# Patient Record
Sex: Female | Born: 1950 | Race: Black or African American | Hispanic: No | Smoking: Current every day smoker
Health system: Southern US, Community
[De-identification: ages and names within clinical notes are randomized; demographics above are authoritative.]

## PROBLEM LIST (undated history)

## (undated) DIAGNOSIS — I10 Essential (primary) hypertension: Secondary | ICD-10-CM

## (undated) DIAGNOSIS — K759 Inflammatory liver disease, unspecified: Secondary | ICD-10-CM

## (undated) DIAGNOSIS — N189 Chronic kidney disease, unspecified: Secondary | ICD-10-CM

## (undated) DIAGNOSIS — D573 Sickle-cell trait: Secondary | ICD-10-CM

## (undated) DIAGNOSIS — F319 Bipolar disorder, unspecified: Secondary | ICD-10-CM

## (undated) DIAGNOSIS — M1990 Unspecified osteoarthritis, unspecified site: Secondary | ICD-10-CM

## (undated) HISTORY — PX: TUBAL LIGATION: SHX77

## (undated) HISTORY — PX: ABDOMINAL HYSTERECTOMY: SHX81

## (undated) HISTORY — DX: Bipolar disorder, unspecified: F31.9

## (undated) HISTORY — DX: Unspecified osteoarthritis, unspecified site: M19.90

## (undated) HISTORY — DX: Chronic kidney disease, unspecified: N18.9

---

## 2011-08-06 ENCOUNTER — Emergency Department (HOSPITAL_COMMUNITY)
Admission: EM | Admit: 2011-08-06 | Discharge: 2011-08-06 | Disposition: A | Discharge status: Home / Self Care | Payer: Medicare Other | Source: Home / Self Care | Attending: Emergency Medicine | Admitting: Emergency Medicine

## 2011-08-06 ENCOUNTER — Emergency Department (HOSPITAL_COMMUNITY): Payer: Medicare Other

## 2011-08-06 ENCOUNTER — Inpatient Hospital Stay (HOSPITAL_COMMUNITY)
Admission: EM | Admit: 2011-08-06 | Discharge: 2011-08-09 | DRG: 419 | Disposition: A | Discharge status: Home / Self Care | Payer: Medicare Other | Attending: Surgery | Admitting: Surgery

## 2011-08-06 DIAGNOSIS — I10 Essential (primary) hypertension: Secondary | ICD-10-CM | POA: Diagnosis present

## 2011-08-06 DIAGNOSIS — D573 Sickle-cell trait: Secondary | ICD-10-CM | POA: Diagnosis present

## 2011-08-06 DIAGNOSIS — K8000 Calculus of gallbladder with acute cholecystitis without obstruction: Principal | ICD-10-CM | POA: Diagnosis present

## 2011-08-06 DIAGNOSIS — F329 Major depressive disorder, single episode, unspecified: Secondary | ICD-10-CM | POA: Diagnosis present

## 2011-08-06 DIAGNOSIS — R110 Nausea: Secondary | ICD-10-CM | POA: Diagnosis not present

## 2011-08-06 DIAGNOSIS — F17200 Nicotine dependence, unspecified, uncomplicated: Secondary | ICD-10-CM | POA: Diagnosis present

## 2011-08-06 DIAGNOSIS — F3289 Other specified depressive episodes: Secondary | ICD-10-CM | POA: Diagnosis present

## 2011-08-06 LAB — COMPREHENSIVE METABOLIC PANEL
ALT: 57 U/L — ABNORMAL HIGH (ref 0–35)
AST: 52 U/L — ABNORMAL HIGH (ref 0–37)
Albumin: 3.4 g/dL — ABNORMAL LOW (ref 3.5–5.2)
Albumin: 3.3 g/dL — ABNORMAL LOW (ref 3.5–5.2)
Alkaline Phosphatase: 97 U/L (ref 39–117)
BUN: 13 mg/dL (ref 6–23)
Chloride: 108 mEq/L (ref 96–112)
Creatinine, Ser: 0.96 mg/dL (ref 0.50–1.10)
GFR calc Af Amer: 61 mL/min — ABNORMAL LOW (ref >90)
Glucose, Bld: 91 mg/dL (ref 70–99)
Potassium: 3.7 mEq/L (ref 3.5–5.1)
Potassium: 3.8 mEq/L (ref 3.5–5.1)
Sodium: 139 mEq/L (ref 135–145)
Total Bilirubin: 0.4 mg/dL (ref 0.3–1.2)
Total Protein: 8 g/dL (ref 6.0–8.3)
Total Protein: 7.3 g/dL (ref 6.0–8.3)

## 2011-08-06 LAB — URINE MICROSCOPIC-ADD ON

## 2011-08-06 LAB — CBC
HCT: 39.2 % (ref 36.0–46.0)
HCT: 37.2 % (ref 36.0–46.0)
Hemoglobin: 13.2 g/dL (ref 12.0–15.0)
MCH: 32.8 pg (ref 26.0–34.0)
MCH: 32.8 pg (ref 26.0–34.0)
MCHC: 35.7 g/dL (ref 30.0–36.0)
MCHC: 35.5 g/dL (ref 30.0–36.0)
MCV: 91.8 fL (ref 78.0–100.0)
RBC: 4.03 MIL/uL (ref 3.87–5.11)
RDW: 12.3 % (ref 11.5–15.5)
WBC: 4.9 10*3/uL (ref 4.0–10.5)

## 2011-08-06 LAB — URINALYSIS, ROUTINE W REFLEX MICROSCOPIC
Bilirubin Urine: NEGATIVE
Glucose, UA: NEGATIVE mg/dL
Hgb urine dipstick: NEGATIVE
Hgb urine dipstick: NEGATIVE
Ketones, ur: NEGATIVE mg/dL
Ketones, ur: NEGATIVE mg/dL
Protein, ur: NEGATIVE mg/dL
Specific Gravity, Urine: 1.011 (ref 1.005–1.030)
Urobilinogen, UA: 1 mg/dL (ref 0.0–1.0)
pH: 6.5 (ref 5.0–8.0)

## 2011-08-06 LAB — RAPID URINE DRUG SCREEN, HOSP PERFORMED
Amphetamines: NOT DETECTED
Benzodiazepines: NOT DETECTED
Opiates: NOT DETECTED
Tetrahydrocannabinol: NOT DETECTED

## 2011-08-06 LAB — DIFFERENTIAL
Eosinophils Relative: 4 % (ref 0–5)
Lymphocytes Relative: 56 % — ABNORMAL HIGH (ref 12–46)
Lymphocytes Relative: 56 % — ABNORMAL HIGH (ref 12–46)
Lymphs Abs: 2.7 10*3/uL (ref 0.7–4.0)
Lymphs Abs: 2.6 10*3/uL (ref 0.7–4.0)
Monocytes Absolute: 0.3 10*3/uL (ref 0.1–1.0)
Monocytes Absolute: 0.3 10*3/uL (ref 0.1–1.0)
Monocytes Relative: 6 % (ref 3–12)
Monocytes Relative: 6 % (ref 3–12)
Neutro Abs: 1.6 10*3/uL — ABNORMAL LOW (ref 1.7–7.7)
Neutrophils Relative %: 34 % — ABNORMAL LOW (ref 43–77)

## 2011-08-06 LAB — LIPASE, BLOOD: Lipase: 49 U/L (ref 11–59)

## 2011-08-07 ENCOUNTER — Other Ambulatory Visit (INDEPENDENT_AMBULATORY_CARE_PROVIDER_SITE_OTHER): Payer: Self-pay | Admitting: Surgery

## 2011-08-07 ENCOUNTER — Inpatient Hospital Stay (HOSPITAL_COMMUNITY): Payer: Medicare Other

## 2011-08-07 DIAGNOSIS — R110 Nausea: Secondary | ICD-10-CM

## 2011-08-07 DIAGNOSIS — R1011 Right upper quadrant pain: Secondary | ICD-10-CM

## 2011-08-07 DIAGNOSIS — K8010 Calculus of gallbladder with chronic cholecystitis without obstruction: Secondary | ICD-10-CM

## 2011-08-07 HISTORY — PX: CHOLECYSTECTOMY: SHX55

## 2011-08-07 LAB — CBC
HCT: 35.1 % — ABNORMAL LOW (ref 36.0–46.0)
Hemoglobin: 12 g/dL (ref 12.0–15.0)
MCH: 31.7 pg (ref 26.0–34.0)
MCHC: 34.2 g/dL (ref 30.0–36.0)
MCV: 92.9 fL (ref 78.0–100.0)
RDW: 12.3 % (ref 11.5–15.5)

## 2011-08-07 LAB — COMPREHENSIVE METABOLIC PANEL
Albumin: 3 g/dL — ABNORMAL LOW (ref 3.5–5.2)
Alkaline Phosphatase: 79 U/L (ref 39–117)
BUN: 13 mg/dL (ref 6–23)
Calcium: 8.8 mg/dL (ref 8.4–10.5)
Creatinine, Ser: 0.98 mg/dL (ref 0.50–1.10)
GFR calc Af Amer: 72 mL/min — ABNORMAL LOW (ref >90)
Glucose, Bld: 94 mg/dL (ref 70–99)
Total Protein: 6.6 g/dL (ref 6.0–8.3)

## 2011-08-09 NOTE — H&P (Addendum)
NAMECADE, OLBERDING            ACCOUNT NO.:  [id number]  MEDICAL RECORD NO.:  [id number]  LOCATION:  [facility]                         FACILITY:  [facility]  PHYSICIAN:  Juanetta Gosling, MDDATE OF BIRTH:  1951/10/16  DATE OF ADMISSION:  08/06/2011 DATE OF DISCHARGE:  08/06/2011                             HISTORY & PHYSICAL   CHIEF COMPLAINT:  Abdominal pain.  HISTORY OF PRESENT ILLNESS:  This is a 60 year old female who has a prior known history per her report of gallstones in 2004, for which she had pain.  She was recommended surgery at that time.  She did not pursue this.  Over the past month, she has had basically consistent right upper quadrant pain that has worsened over at this time.  She has been using a fair amount of ibuprofen for this and this is not relieving her pain over the last several days, which caused her to present to the emergency room.  This is aggravated by eating and movement.  It was somewhat relieved by taking ibuprofen, but then it was not working anymore causing her to come to the emergency room.  Today, she complains of significant right upper quadrant pain as well as some mild diffuse abdominal pain.  She has some nausea associated with this without any emesis.  She has been having bowel movements with less frequency, but has been having them normally without any blood.  She does not have a history of a colonoscopy.  She does not have any weight loss associated with this.  She does not have any urinary symptoms associated with this either she.  PAST MEDICAL HISTORY:  Significant for depression, hypertension, sickle cell trait.  PAST SURGICAL HISTORY:  C-section, bilateral tubal ligation followed by hysterectomy.  FAMILY HISTORY:  Noncontributory.  SOCIAL HISTORY:  She does smoke.  She denied any use of any drugs to me tonight and she drinks occasional alcohol.  DRUG ALLERGIES:  None known.  MEDICATIONS:  Remeron and Ambien.  REVIEW OF  SYSTEMS:  Otherwise negative.  PHYSICAL EXAMINATION:  VITAL SIGNS:  98.8, 76, 18, and 146/91, oxygen saturation is 99%. GENERAL:  She is a well-appearing female in no apparent distress. HEENT:  She has no scleral icterus. NECK:  No cervical adenopathy and is supple. CHEST:  Clear bilaterally. HEART:  Regular rate and rhythm. ABDOMEN:  Soft.  She has bowel sounds present.  She is tender in her right upper quadrant with a Murphy sign, and is mildly tender in her epigastrium as well.  Laboratory evaluation shows a urine drug screen where she does have is positive for cocaine.  Lipase is 49.  Her CMET is significant for creatinine of 0.96, alk mildly elevated at 52, ALT mildly elevated at 53, and normal bilirubin is 0.4.  Urinalysis shows leukocytes.  CBC is significant for white blood cell count of 4.6 hematocrit 37.2, platelets of 281.  Chest. x-ray done tonight shows no evidence of any acute cardiopulmonary disease, and ultrasound of her abdomen shows large gallstones with a gallbladder wall of 5 mm.  The patient is tender over the gallbladder during the procedure and a mildly dilated common duct at 8 mm.  There is also report of distal  gastric wall thickening, questioning gastritis.  ASSESSMENT:  Acute cholecystitis.  PLAN:  We discussed admission making her n.p.o., beginning her on some antibiotics, and with that she will need a cholecystectomy, we discussed that she certainly appears to be high risk for an open cholecystectomy given the length of her symptoms.  I do not think it will be reasonable to proceed with a cholecystectomy this admission.  I think that the likely distal gastric wall thickening may be related to her recent NSAID use, and there is no other indications that is the source of her current problem, but she certainly will need a followup for this over the long- term as well.     Juanetta Gosling, MD   ______________________________ Juanetta Gosling, MD    MCW/MEDQ  D:  08/07/2011  T:  08/07/2011  Job:  [id number]  cc:   Lear Ng, MD  Electronically Signed by Emelia Loron MD on 08/15/2011 03:40:35 PM

## 2011-08-09 NOTE — Op Note (Signed)
NAMENELSY, Natasha Davila            ACCOUNT NO.:  [id number]  MEDICAL RECORD NO.:  [id number]  LOCATION:  5127                         FACILITY:  [facility]  PHYSICIAN:  Velora Heckler, MD      DATE OF BIRTH:  Apr 18, 1951  DATE OF PROCEDURE:  08/07/2011                               OPERATIVE REPORT   PREOPERATIVE DIAGNOSIS:  Symptomatic cholelithiasis.  POSTOPERATIVE DIAGNOSIS:  Symptomatic cholelithiasis.  PROCEDURE:  Laparoscopic cholecystectomy with intraoperative cholangiography.  SURGEON:  Velora Heckler, MD, FACS  ANESTHESIA:  General per Dr. Judie Petit.  ESTIMATED BLOOD LOSS:  Minimal.  PREPARATION:  ChloraPrep.  COMPLICATIONS:  None.  INDICATIONS:  The patient is a 60 year old black female who presents to the emergency department with abdominal pain.  She has had intermittent pain for approximately 6 years.  She has known gallstones.  The patient now presents with biliary colic.  She was seen and evaluated in the emergency department and admitted to the General Surgery Service.  She is prepared for surgery for cholecystectomy.  BODY OF REPORT:  Procedure was done in OR #17 at the [facility].  The patient was brought to the operating room, placed in supine position on the operating room table.  Following administration of general anesthesia, the patient was positioned and then prepped and draped in the usual strict aseptic fashion.  After ascertaining that an adequate level of anesthesia had been achieved, an infraumbilical incision was made with a #15 blade.  Dissection was carried through subcutaneous tissues.  Fascia was incised in the midline and the peritoneal cavity was entered cautiously.  There were adhesions at the level of the umbilicus.  These appeared to be omentum.  There did not appear to be any small bowel adhesions.  Operative ports were placed along the right costal margin in the midline, midclavicular line, and anterior  axillary line.  Gallbladder fundus was grasped and retracted cephalad.  The peritoneum at the neck of the gallbladder was incised. Gallbladder neck was carefully dissected out.  Cystic duct was identified and dissected out along its length.  Clip was placed at the neck of the gallbladder.  Cystic duct was incised.  A Cook cholangiography catheter was introduced through a stab wound in the right upper quadrant.  It was inserted into the cystic duct and secured with a Ligaclip.  Using C-arm fluoroscopy, real time cholangiography was performed.  There was rapid filling of a mildly dilated common bile duct.  There was free flow distally into the duodenum without filling defect or obstruction.  There was reflux of contrast into the right and left hepatic ductal systems.  There was also slight reflux of contrast into the distal pancreatic duct.  Clip was withdrawn and Cook catheter was removed from the peritoneal cavity.  Cystic duct was triply clipped and divided.  The cystic artery was dissected out along its length.  It was doubly clipped proximally and distally and divided.  Gallbladder was then excised from the gallbladder bed using the hook electrocautery for hemostasis.  Gallbladder was completely excised and placed into an EndoCatch bag.  It was withdrawn through the umbilical port without difficulty.  A 0-Vicryl purse-string suture was  tied securely.  Right upper quadrant was irrigated with warm saline.  This was evacuated. Good hemostasis was noted.  Pneumoperitoneum was released and ports were removed under direct vision.  Good hemostasis was noted at all port sites.  Pneumoperitoneum was evacuated.  Port sites were anesthetized with local anesthetic.  Skin incisions were closed with interrupted 4-0 Monocryl subcuticular sutures.  Wounds were washed and dried and benzoin and Steri-Strips were applied.  Sterile dressings were applied.  The patient was awakened from anesthesia and  brought to the recovery room. The patient tolerated the procedure well.   Velora Heckler, MD, FACS     TMG/MEDQ  D:  08/07/2011  T:  08/07/2011  Job:  [id number]  Electronically Signed by Darnell Level MD on 08/09/2011 09:18:16 AM

## 2011-08-21 NOTE — Discharge Summary (Signed)
  Natasha Davila, Natasha Davila            ACCOUNT NO.:  [id number]  MEDICAL RECORD NO.:  [id number]  LOCATION:  5127                         FACILITY:  [facility]  PHYSICIAN:  Juanetta Gosling, MDDATE OF BIRTH:  December 04, 1950  DATE OF ADMISSION:  08/06/2011 DATE OF DISCHARGE:  08/09/2011                              DISCHARGE SUMMARY   HISTORY OF PRESENT ILLNESS:  Ms. Natasha Davila is a 60 year old African American female who has a known history of gallstones, presented with recurrent abdominal pain.  Her workup including a repeat ultrasound showed evidence of gallbladder wall thickening, and clinical exam was consistent with evidence of acute cholecystitis.  Decision was made to admit the patient for operative management.  SUMMARY OF HOSPITAL COURSE:  The patient was admitted on August 06, 2011, was taken to the operating room on August 07, 2011, underwent laparoscopic cholecystectomy with intraoperative cholangiogram showing no evidence of ductal obstruction.  Postoperatively, she had a little bit of weakness and nausea.  She actually felt very unstable walking. We got a Physical Therapy consult who essentially thought she just had some generalized weakness following surgery and that a rolling walker would be sufficient for her to go home.  On postop day 2, she is eating regular diet, no longer any nausea.  She has been passing flatus and voiding well.  She feels appropriate for discharge home today.  DISCHARGE DIAGNOSIS:  Acute cholecystitis status post laparoscopic cholecystectomy.  DISCHARGE MEDICATIONS:  The patient's home meds will be resumed including: 1. Ambien 10 mg daily at bedtime. 2. Mirtazapine 40 mg daily at bedtime.  She is given a prescription     for Vicodin 1-2 tablets q.4 h. p.r.n. pain.  She is given     preprinted discharge instructions and a followup appointment in our     office for about 2 weeks.     Natasha El,  PA-C   ______________________________ Juanetta Gosling, MD    KB/MEDQ  D:  08/09/2011  T:  08/09/2011  Job:  [id number]  Electronically Signed by Natasha Davila  on 08/15/2011 03:44:17 PM Electronically Signed by Darnell Level MD on 08/21/2011 11:09:05 AM

## 2011-08-28 ENCOUNTER — Ambulatory Visit (INDEPENDENT_AMBULATORY_CARE_PROVIDER_SITE_OTHER): Payer: Medicare Other | Admitting: General Surgery

## 2011-08-28 ENCOUNTER — Encounter (INDEPENDENT_AMBULATORY_CARE_PROVIDER_SITE_OTHER): Payer: Self-pay

## 2011-08-28 VITALS — BP 116/86 | HR 72 | Temp 97.2°F | Resp 20 | Ht 66.5 in | Wt 142.2 lb

## 2011-08-28 DIAGNOSIS — K8010 Calculus of gallbladder with chronic cholecystitis without obstruction: Secondary | ICD-10-CM

## 2011-08-28 MED ORDER — HYDROCODONE-ACETAMINOPHEN 10-325 MG PO TABS: 1.0000 | ORAL_TABLET | Freq: Four times a day (QID) | ORAL | Status: DC | PRN

## 2011-08-28 NOTE — Progress Notes (Signed)
Natasha Davila [facility] December 13, 1950 [id number] 08/28/2011   Natasha Davila is a 60 y.o. female who had a laparoscopic cholecystectomy with intraoperative cholangiogram10/15/12, Dr. Gerrit Friends.  The pathology report confirmed cholecystitis, cholelithiasis.  The patient reports that they are feeling well with normal bowel movements and good appetite.  The pre-operative symptoms of abdominal pain, nausea, and vomiting have resolved.  Still complaining of some abd pain, blosting ask for more vicodin    Physical examination - Incisions appear well-healed with no sign of infection or bleeding.   Abdomen - soft, non-tender, strei strips removed.  Impression:  s/p laparoscopic cholecystectomy  Plan:  She may resume a regular diet and full activity.  She may follow-up on a PRN basis.

## 2011-08-28 NOTE — Patient Instructions (Signed)
Call if you have any further problems.

## 2012-03-24 ENCOUNTER — Encounter (HOSPITAL_COMMUNITY): Payer: Self-pay | Admitting: Emergency Medicine

## 2012-03-24 ENCOUNTER — Emergency Department (HOSPITAL_COMMUNITY)
Admission: EM | Admit: 2012-03-24 | Discharge: 2012-03-24 | Disposition: A | Discharge status: Home / Self Care | Payer: Medicare Other | Attending: Emergency Medicine | Admitting: Emergency Medicine

## 2012-03-24 ENCOUNTER — Emergency Department (HOSPITAL_COMMUNITY): Payer: Medicare Other

## 2012-03-24 DIAGNOSIS — F3289 Other specified depressive episodes: Secondary | ICD-10-CM | POA: Insufficient documentation

## 2012-03-24 DIAGNOSIS — I129 Hypertensive chronic kidney disease with stage 1 through stage 4 chronic kidney disease, or unspecified chronic kidney disease: Secondary | ICD-10-CM | POA: Insufficient documentation

## 2012-03-24 DIAGNOSIS — Z79899 Other long term (current) drug therapy: Secondary | ICD-10-CM | POA: Insufficient documentation

## 2012-03-24 DIAGNOSIS — F411 Generalized anxiety disorder: Secondary | ICD-10-CM | POA: Insufficient documentation

## 2012-03-24 DIAGNOSIS — N189 Chronic kidney disease, unspecified: Secondary | ICD-10-CM | POA: Insufficient documentation

## 2012-03-24 DIAGNOSIS — I10 Essential (primary) hypertension: Secondary | ICD-10-CM | POA: Insufficient documentation

## 2012-03-24 DIAGNOSIS — F17200 Nicotine dependence, unspecified, uncomplicated: Secondary | ICD-10-CM | POA: Insufficient documentation

## 2012-03-24 DIAGNOSIS — R079 Chest pain, unspecified: Secondary | ICD-10-CM | POA: Insufficient documentation

## 2012-03-24 DIAGNOSIS — M549 Dorsalgia, unspecified: Secondary | ICD-10-CM | POA: Insufficient documentation

## 2012-03-24 DIAGNOSIS — F329 Major depressive disorder, single episode, unspecified: Secondary | ICD-10-CM | POA: Insufficient documentation

## 2012-03-24 HISTORY — DX: Essential (primary) hypertension: I10

## 2012-03-24 MED ORDER — IBUPROFEN 200 MG PO TABS
600.0000 mg | ORAL_TABLET | Freq: Once | ORAL | Status: AC
  Administered 2012-03-24: 600 mg via ORAL
  Filled 2012-03-24: qty 3

## 2012-03-24 MED ORDER — OXYCODONE-ACETAMINOPHEN 5-325 MG PO TABS
1.0000 | ORAL_TABLET | Freq: Once | ORAL | Status: AC
  Administered 2012-03-24: 1 via ORAL
  Filled 2012-03-24: qty 1

## 2012-03-24 MED ORDER — HYDROCODONE-ACETAMINOPHEN 5-500 MG PO TABS: 1.0000 | ORAL_TABLET | Freq: Four times a day (QID) | ORAL | Status: AC | PRN

## 2012-03-24 NOTE — ED Notes (Signed)
Pt returned from xr

## 2012-03-24 NOTE — ED Notes (Signed)
Pt c/o of neck and back constant burning sensation.

## 2012-03-24 NOTE — ED Notes (Signed)
MD at bedside. 
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
## 2012-03-24 NOTE — ED Notes (Signed)
I cut off patients shirts to put on her leads and placed it and her shoes in a patient belongings bag.

## 2012-03-24 NOTE — ED Notes (Signed)
Drenda Freeze, PA made aware of backboard removal.
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
## 2012-09-26 ENCOUNTER — Emergency Department (HOSPITAL_COMMUNITY)
Admission: EM | Admit: 2012-09-26 | Discharge: 2012-09-26 | Disposition: A | Discharge status: Home / Self Care | Payer: Medicare Other | Attending: Emergency Medicine | Admitting: Emergency Medicine

## 2012-09-26 ENCOUNTER — Encounter (HOSPITAL_COMMUNITY): Payer: Self-pay | Admitting: Family Medicine

## 2012-09-26 DIAGNOSIS — N189 Chronic kidney disease, unspecified: Secondary | ICD-10-CM | POA: Insufficient documentation

## 2012-09-26 DIAGNOSIS — R109 Unspecified abdominal pain: Secondary | ICD-10-CM | POA: Insufficient documentation

## 2012-09-26 DIAGNOSIS — Z791 Long term (current) use of non-steroidal anti-inflammatories (NSAID): Secondary | ICD-10-CM | POA: Insufficient documentation

## 2012-09-26 DIAGNOSIS — F17200 Nicotine dependence, unspecified, uncomplicated: Secondary | ICD-10-CM | POA: Insufficient documentation

## 2012-09-26 DIAGNOSIS — N390 Urinary tract infection, site not specified: Secondary | ICD-10-CM

## 2012-09-26 DIAGNOSIS — F341 Dysthymic disorder: Secondary | ICD-10-CM | POA: Insufficient documentation

## 2012-09-26 DIAGNOSIS — Z79899 Other long term (current) drug therapy: Secondary | ICD-10-CM | POA: Insufficient documentation

## 2012-09-26 DIAGNOSIS — I10 Essential (primary) hypertension: Secondary | ICD-10-CM | POA: Insufficient documentation

## 2012-09-26 LAB — CBC WITH DIFFERENTIAL/PLATELET
Basophils Absolute: 0 10*3/uL (ref 0.0–0.1)
HCT: 39.1 % (ref 36.0–46.0)
Hemoglobin: 13.3 g/dL (ref 12.0–15.0)
Lymphocytes Relative: 49 % — ABNORMAL HIGH (ref 12–46)
Lymphs Abs: 2.5 10*3/uL (ref 0.7–4.0)
Monocytes Absolute: 0.3 10*3/uL (ref 0.1–1.0)
Neutro Abs: 2 10*3/uL (ref 1.7–7.7)
RBC: 4.28 MIL/uL (ref 3.87–5.11)
RDW: 12.2 % (ref 11.5–15.5)
WBC: 5 10*3/uL (ref 4.0–10.5)

## 2012-09-26 LAB — URINALYSIS, ROUTINE W REFLEX MICROSCOPIC
Protein, ur: 100 mg/dL — AB
Urobilinogen, UA: 1 mg/dL (ref 0.0–1.0)

## 2012-09-26 LAB — COMPREHENSIVE METABOLIC PANEL
ALT: 36 U/L — ABNORMAL HIGH (ref 0–35)
AST: 44 U/L — ABNORMAL HIGH (ref 0–37)
CO2: 27 mEq/L (ref 19–32)
Chloride: 106 mEq/L (ref 96–112)
Creatinine, Ser: 1.24 mg/dL — ABNORMAL HIGH (ref 0.50–1.10)
GFR calc non Af Amer: 46 mL/min — ABNORMAL LOW (ref >90)
Glucose, Bld: 119 mg/dL — ABNORMAL HIGH (ref 70–99)
Sodium: 144 mEq/L (ref 135–145)
Total Bilirubin: 0.6 mg/dL (ref 0.3–1.2)

## 2012-09-26 LAB — URINE MICROSCOPIC-ADD ON

## 2012-09-26 MED ORDER — CEFTRIAXONE SODIUM 1 G IJ SOLR
1.0000 g | Freq: Once | INTRAMUSCULAR | Status: AC
  Administered 2012-09-26: 1 g via INTRAMUSCULAR
  Filled 2012-09-26: qty 10

## 2012-09-26 MED ORDER — DEXTROSE 5 % IV SOLN: 1.0000 g | Freq: Once | INTRAVENOUS | Status: DC

## 2012-09-26 MED ORDER — LIDOCAINE HCL (PF) 1 % IJ SOLN
INTRAMUSCULAR | Status: AC
  Administered 2012-09-26: 5 mL
  Filled 2012-09-26: qty 5

## 2012-09-26 MED ORDER — SODIUM CHLORIDE 0.9 % IV BOLUS (SEPSIS)
1000.0000 mL | Freq: Once | INTRAVENOUS | Status: DC
Start: 2012-09-26 — End: 2012-09-26

## 2012-09-26 MED ORDER — ONDANSETRON HCL 4 MG/2ML IJ SOLN: 4.0000 mg | Freq: Once | INTRAMUSCULAR | Status: DC

## 2012-09-26 MED ORDER — HYDROMORPHONE HCL PF 1 MG/ML IJ SOLN: 1.0000 mg | Freq: Once | INTRAMUSCULAR | Status: DC

## 2012-09-26 MED ORDER — PHENAZOPYRIDINE HCL 200 MG PO TABS: 200.0000 mg | ORAL_TABLET | Freq: Three times a day (TID) | ORAL | Status: DC | PRN

## 2012-09-26 MED ORDER — CEPHALEXIN 500 MG PO CAPS: 500.0000 mg | ORAL_CAPSULE | Freq: Four times a day (QID) | ORAL | Status: DC

## 2012-09-26 NOTE — ED Notes (Signed)
Per pt sts she has had hematuria since yesterday. sts also some abdominal cramping.

## 2012-09-26 NOTE — ED Notes (Signed)
Pt unable to void right now. 
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
## 2012-09-26 NOTE — ED Notes (Signed)
Pt requesting to be discharged. States she left her groceries out at home and needs to go home. PA notified.

## 2012-09-27 LAB — URINE CULTURE

## 2012-09-28 NOTE — ED Provider Notes (Signed)
History     CSN: [id number]  Arrival date & time 09/26/12  1327   First MD Initiated Contact with Patient 09/26/12 1912      Chief Complaint  Patient presents with  . Dysuria    (Consider location/radiation/quality/duration/timing/severity/associated sxs/prior treatment) HPI 61 yo female with pmh of papillary necrosis of the kidney presents with cc of hematuria.  She has had intermittent bouts of hematuria over her lifetime. She has no nephrologist or urologist here in town and has not had hematuria in many years.  Onset yesterday and described as profuse.  Patient states she just moved and was lifting heavy boxes. C/o suprapubic pain. Denies dysuria, flank pain, frequency, or urgency.  Not sexually active. UA nitrite positive and shows infection.  Denies fevers, chills, myalgias, arthralgias. Denies DOE, SOB, chest tightness or pressure, radiation to left arm, jaw or back, or diaphoresis. Denies headaches, light headedness, weakness, visual disturbances. Denies abdominal pain, nausea, vomiting, diarrhea or constipation.     Past Medical History  Diagnosis Date  . Arthritis   . Chronic kidney disease   . Hypertension   . Depression   . Anxiety     Past Surgical History  Procedure Date  . Cholecystectomy 08/07/11  . Abdominal hysterectomy   . Tubal ligation     History reviewed. No pertinent family history.  History  Substance Use Topics  . Smoking status: Current Every Day Smoker -- 0.2 packs/day  . Smokeless tobacco: Never Used  . Alcohol Use: Yes    OB History    Grav Para Term Preterm Abortions TAB SAB Ect Mult Living                  Review of Systems Ten systems are reviewed and are negative for acute change except as noted in the HPI  Allergies  Review of patient's allergies indicates no known allergies.  Home Medications   Current Outpatient Rx  Name  Route  Sig  Dispense  Refill  . CYCLOBENZAPRINE HCL 10 MG PO TABS   Oral   Take 10 mg by mouth  3 (three) times daily as needed. For muscle spasms         . DICLOFENAC SODIUM 75 MG PO TBEC   Oral   Take 75 mg by mouth 2 (two) times daily.         Marland Kitchen HYDROCODONE-ACETAMINOPHEN 5-325 MG PO TABS   Oral   Take 1 tablet by mouth 2 (two) times daily as needed. For pain         . IBUPROFEN 200 MG PO TABS   Oral   Take 800-1,600 mg by mouth every 6 (six) hours as needed. For pain         . LIDOCAINE 5 % EX PTCH   Transdermal   Place 1 patch onto the skin daily. Remove & Discard patch within 12 hours or as directed by MD         . LISINOPRIL 10 MG PO TABS   Oral   Take 10 mg by mouth daily.         Marland Kitchen MIRTAZAPINE 45 MG PO TABS   Oral   Take 45 mg by mouth at bedtime.           Marland Kitchen QUETIAPINE FUMARATE 25 MG PO TABS   Oral   Take 25-50 mg by mouth at bedtime as needed. For sleep         . ZOLPIDEM TARTRATE 5 MG PO TABS  Oral   Take 5 mg by mouth at bedtime as needed. For sleep         . CEPHALEXIN 500 MG PO CAPS   Oral   Take 1 capsule (500 mg total) by mouth 4 (four) times daily.   28 capsule   0   . PHENAZOPYRIDINE HCL 200 MG PO TABS   Oral   Take 1 tablet (200 mg total) by mouth 3 (three) times daily as needed for pain.   6 tablet   0     BP 167/106  Pulse 99  Temp 97.8 F (36.6 C) (Oral)  Resp 20  SpO2 98%  Physical Exam Physical Exam  Nursing note and vitals reviewed. Constitutional: She is oriented to person, place, and time. She appears well-developed and well-nourished. No distress.  HENT:  Head: Normocephalic and atraumatic.  Eyes: Conjunctivae normal and EOM are normal. Pupils are equal, round, and reactive to light. No scleral icterus.  Neck: Normal range of motion.  Cardiovascular: Normal rate, regular rhythm and normal heart sounds.  Exam reveals no gallop and no friction rub.   No murmur heard. Pulmonary/Chest: Effort normal and breath sounds normal. No respiratory distress.  Abdominal: Soft. Bowel sounds are normal. She  exhibits no distension and no mass. There is no tenderness. There is no guarding.  Neurological: She is alert and oriented to person, place, and time. 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 Skin: Skin is warm and dry. She is not diaphoretic.    ED Course  Procedures (including critical care time)  Labs Reviewed  CBC WITH DIFFERENTIAL - Abnormal; Notable for the following:    Neutrophils Relative 41 (*)     Lymphocytes Relative 49 (*)     All other components within normal limits  COMPREHENSIVE METABOLIC PANEL - Abnormal; Notable for the following:    Potassium 3.4 (*)     Glucose, Bld 119 (*)     Creatinine, Ser 1.24 (*)     Total Protein 8.5 (*)     AST 44 (*)     ALT 36 (*)     GFR calc non Af Amer 46 (*)     GFR calc Af Amer 54 (*)     All other components within normal limits  URINALYSIS, ROUTINE W REFLEX MICROSCOPIC - Abnormal; Notable for the following:    Color, Urine RED (*)  BIOCHEMICALS MAY BE AFFECTED BY COLOR   APPearance TURBID (*)     Hgb urine dipstick LARGE (*)     Bilirubin Urine MODERATE (*)     Ketones, ur 15 (*)     Protein, ur 100 (*)     Nitrite POSITIVE (*)     Leukocytes, UA MODERATE (*)     All other components within normal limits  URINE MICROSCOPIC-ADD ON - Abnormal; Notable for the following:    Bacteria, UA FEW (*)     Casts HYALINE CASTS (*)     All other components within normal limits  URINE CULTURE  LAB REPORT - SCANNED   No results found.   1. UTI (lower urinary tract infection)       MDM   Filed Vitals:   09/26/12 1909  BP: 167/106  Pulse: 99  Temp:   Resp: 20   Patient with likely UA and possible component of papillary necrosis.  She has mildly elevated creatinine and renal insufficiency ans small transaminitis.  Patient states that she left her groceries out and needs to get home. She  does not want an IV, she will take IM rocephin.  I will allow patient to leave, but she needs close f/u with her PCP and urology or  nephrology. Especially if bleeding does not resolve within the next 3 days.  I have explained lab findings and make patient aware of her elevated BP as well.  Will d/c with keflex. PCP and urology f/u. Discussed reasons to seek immediate care. Patient expresses understanding and agrees with plan.         Arthor Captain, PA-C 09/28/12 [phone]

## 2012-09-28 NOTE — ED Provider Notes (Signed)
Medical screening examination/treatment/procedure(s) were performed by non-physician practitioner and as supervising physician I was immediately available for consultation/collaboration.  Derwood Kaplan, MD 09/28/12 2324

## 2012-10-16 ENCOUNTER — Other Ambulatory Visit: Payer: Self-pay | Admitting: Internal Medicine

## 2012-10-16 DIAGNOSIS — Z1231 Encounter for screening mammogram for malignant neoplasm of breast: Secondary | ICD-10-CM

## 2012-10-30 ENCOUNTER — Ambulatory Visit: Payer: Medicare Other

## 2012-12-12 ENCOUNTER — Other Ambulatory Visit (HOSPITAL_COMMUNITY): Payer: Self-pay | Admitting: Orthopaedic Surgery

## 2012-12-22 ENCOUNTER — Encounter (HOSPITAL_COMMUNITY): Payer: Self-pay | Admitting: Pharmacy Technician

## 2012-12-29 ENCOUNTER — Encounter (HOSPITAL_COMMUNITY): Payer: Self-pay

## 2012-12-29 ENCOUNTER — Encounter (HOSPITAL_COMMUNITY)
Admission: RE | Admit: 2012-12-29 | Discharge: 2012-12-29 | Disposition: A | Discharge status: Home / Self Care | Payer: Medicare Other | Source: Ambulatory Visit | Attending: Orthopaedic Surgery | Admitting: Orthopaedic Surgery

## 2012-12-29 HISTORY — DX: Inflammatory liver disease, unspecified: K75.9

## 2012-12-29 HISTORY — DX: Sickle-cell trait: D57.3

## 2012-12-29 LAB — COMPREHENSIVE METABOLIC PANEL
AST: 47 U/L — ABNORMAL HIGH (ref 0–37)
Albumin: 3.5 g/dL (ref 3.5–5.2)
Alkaline Phosphatase: 101 U/L (ref 39–117)
BUN: 9 mg/dL (ref 6–23)
Chloride: 104 mEq/L (ref 96–112)
Potassium: 3.6 mEq/L (ref 3.5–5.1)
Total Bilirubin: 0.5 mg/dL (ref 0.3–1.2)

## 2012-12-29 LAB — CBC
HCT: 40.8 % (ref 36.0–46.0)
MCH: 30.3 pg (ref 26.0–34.0)
MCHC: 32.8 g/dL (ref 30.0–36.0)
MCV: 92.3 fL (ref 78.0–100.0)
RDW: 12 % (ref 11.5–15.5)
WBC: 5.2 10*3/uL (ref 4.0–10.5)

## 2012-12-29 LAB — PROTIME-INR: INR: 0.92 (ref 0.00–1.49)

## 2012-12-29 LAB — URINALYSIS, ROUTINE W REFLEX MICROSCOPIC
Hgb urine dipstick: NEGATIVE
Specific Gravity, Urine: 1.018 (ref 1.005–1.030)
Urobilinogen, UA: 2 mg/dL — ABNORMAL HIGH (ref 0.0–1.0)

## 2012-12-29 LAB — URINE MICROSCOPIC-ADD ON

## 2012-12-29 LAB — ABO/RH: ABO/RH(D): A POS

## 2012-12-29 LAB — SURGICAL PCR SCREEN: Staphylococcus aureus: POSITIVE — AB

## 2012-12-29 NOTE — Patient Instructions (Signed)
Natasha Davila  12/29/2012   Your procedure is scheduled on:  01/02/13   Report to [facility] at   1215pm  Call this number if you have problems the morning of surgery: [phone]   Remember:   Do not eat food or drink liquids after midnight.   Take these medicines the morning of surgery with A SIP OF WATER:    Do not wear jewelry, make-up or nail polish.  Do not wear lotions, powders, or perfumes. You may wear deodorant.  Do not shave 48 hours prior to surgery.   Do not bring valuables to the hospital.  Contacts, dentures or bridgework may not be worn into surgery.  Leave suitcase in the car. After surgery it may be brought to your room.  For patients admitted to the hospital, checkout time is 11:00 AM the day of  discharge.       SEE CHG INSTRUCTION SHEET    Please read over the following fact sheets that you were given: MRSA Information, coughing and deep breathing exercises, leg exercises, Blood Transfusion Fact sheet                Failure to comply with these instructions may result in cancellation of your surgery.                Patient Signature ____________________________              Nurse Signature _____________________________

## 2012-12-29 NOTE — Progress Notes (Signed)
Initial blood pressure at preop appointment was 174/107.  AT end of preop appointment blood pressure was 159/99.  Patient voiced no complaints and states blood pressure both top and bottom numbers are always in the 3 digits.  Just an FYI.

## 2012-12-30 NOTE — Progress Notes (Signed)
UA faxed to Dr. Maureen Ralphs thru [employer] - spoke with Lupita Leash at office

## 2013-01-02 ENCOUNTER — Encounter (HOSPITAL_COMMUNITY)
Admission: RE | Disposition: A | Discharge status: Skilled Nursing Facility | Payer: Self-pay | Source: Ambulatory Visit | Attending: Orthopaedic Surgery

## 2013-01-02 ENCOUNTER — Encounter (HOSPITAL_COMMUNITY): Payer: Self-pay | Admitting: Anesthesiology

## 2013-01-02 ENCOUNTER — Encounter (HOSPITAL_COMMUNITY): Payer: Self-pay | Admitting: *Deleted

## 2013-01-02 ENCOUNTER — Inpatient Hospital Stay (HOSPITAL_COMMUNITY): Payer: Medicare Other

## 2013-01-02 ENCOUNTER — Inpatient Hospital Stay (HOSPITAL_COMMUNITY)
Admission: RE | Admit: 2013-01-02 | Discharge: 2013-01-05 | DRG: 470 | Disposition: A | Discharge status: Skilled Nursing Facility | Payer: Medicare Other | Source: Ambulatory Visit | Attending: Orthopaedic Surgery | Admitting: Orthopaedic Surgery

## 2013-01-02 ENCOUNTER — Inpatient Hospital Stay (HOSPITAL_COMMUNITY): Payer: Medicare Other | Admitting: Anesthesiology

## 2013-01-02 DIAGNOSIS — I10 Essential (primary) hypertension: Secondary | ICD-10-CM | POA: Diagnosis present

## 2013-01-02 DIAGNOSIS — M1610 Unilateral primary osteoarthritis, unspecified hip: Principal | ICD-10-CM | POA: Diagnosis present

## 2013-01-02 DIAGNOSIS — M169 Osteoarthritis of hip, unspecified: Principal | ICD-10-CM | POA: Diagnosis present

## 2013-01-02 DIAGNOSIS — F17200 Nicotine dependence, unspecified, uncomplicated: Secondary | ICD-10-CM | POA: Diagnosis present

## 2013-01-02 DIAGNOSIS — Z96649 Presence of unspecified artificial hip joint: Secondary | ICD-10-CM

## 2013-01-02 DIAGNOSIS — D62 Acute posthemorrhagic anemia: Secondary | ICD-10-CM | POA: Diagnosis not present

## 2013-01-02 DIAGNOSIS — Z01812 Encounter for preprocedural laboratory examination: Secondary | ICD-10-CM

## 2013-01-02 HISTORY — PX: TOTAL HIP ARTHROPLASTY: SHX124

## 2013-01-02 LAB — TYPE AND SCREEN: ABO/RH(D): A POS

## 2013-01-02 SURGERY — ARTHROPLASTY, HIP, TOTAL, ANTERIOR APPROACH
Anesthesia: Spinal | Site: Hip | Laterality: Left | Wound class: Clean

## 2013-01-02 MED ORDER — FERROUS SULFATE 325 (65 FE) MG PO TABS
325.0000 mg | ORAL_TABLET | Freq: Three times a day (TID) | ORAL | Status: DC
  Administered 2013-01-03 – 2013-01-05 (×7): 325 mg via ORAL
  Filled 2013-01-02 (×11): qty 1

## 2013-01-02 MED ORDER — LISINOPRIL 10 MG PO TABS
10.0000 mg | ORAL_TABLET | Freq: Every day | ORAL | Status: DC
  Administered 2013-01-03 – 2013-01-04 (×2): 10 mg via ORAL
  Filled 2013-01-02 (×4): qty 1

## 2013-01-02 MED ORDER — DIPHENHYDRAMINE HCL 50 MG/ML IJ SOLN
25.0000 mg | Freq: Once | INTRAMUSCULAR | Status: AC
  Administered 2013-01-02: 25 mg via INTRAVENOUS

## 2013-01-02 MED ORDER — METOCLOPRAMIDE HCL 10 MG PO TABS: 5.0000 mg | ORAL_TABLET | Freq: Three times a day (TID) | ORAL | Status: DC | PRN

## 2013-01-02 MED ORDER — METOCLOPRAMIDE HCL 5 MG/ML IJ SOLN
5.0000 mg | Freq: Three times a day (TID) | INTRAMUSCULAR | Status: DC | PRN
Start: 2013-01-02 — End: 2013-01-05

## 2013-01-02 MED ORDER — METHOCARBAMOL 500 MG PO TABS
500.0000 mg | ORAL_TABLET | Freq: Four times a day (QID) | ORAL | Status: DC | PRN
  Administered 2013-01-02 – 2013-01-05 (×3): 500 mg via ORAL
  Filled 2013-01-02 (×3): qty 1

## 2013-01-02 MED ORDER — OXYCODONE HCL ER 10 MG PO T12A
10.0000 mg | EXTENDED_RELEASE_TABLET | Freq: Two times a day (BID) | ORAL | Status: DC
  Administered 2013-01-02 – 2013-01-03 (×2): 10 mg via ORAL
  Filled 2013-01-02 (×3): qty 1

## 2013-01-02 MED ORDER — DIPHENHYDRAMINE HCL 12.5 MG/5ML PO ELIX
12.5000 mg | ORAL_SOLUTION | ORAL | Status: DC | PRN
  Administered 2013-01-03: 25 mg via ORAL
  Administered 2013-01-03 (×2): 12.5 mg via ORAL
  Filled 2013-01-02 (×2): qty 5
  Filled 2013-01-02: qty 10
  Filled 2013-01-02: qty 5

## 2013-01-02 MED ORDER — DOCUSATE SODIUM 100 MG PO CAPS
100.0000 mg | ORAL_CAPSULE | Freq: Two times a day (BID) | ORAL | Status: DC
  Administered 2013-01-02 – 2013-01-05 (×6): 100 mg via ORAL
  Filled 2013-01-02 (×3): qty 1

## 2013-01-02 MED ORDER — MIRTAZAPINE 45 MG PO TABS
45.0000 mg | ORAL_TABLET | Freq: Every day | ORAL | Status: DC
  Administered 2013-01-02 – 2013-01-04 (×3): 45 mg via ORAL
  Filled 2013-01-02 (×4): qty 1

## 2013-01-02 MED ORDER — DIPHENHYDRAMINE HCL 25 MG PO CAPS
25.0000 mg | ORAL_CAPSULE | Freq: Four times a day (QID) | ORAL | Status: DC | PRN
  Administered 2013-01-02: 25 mg via ORAL
  Filled 2013-01-02: qty 1

## 2013-01-02 MED ORDER — ONDANSETRON HCL 4 MG PO TABS: 4.0000 mg | ORAL_TABLET | Freq: Four times a day (QID) | ORAL | Status: DC | PRN

## 2013-01-02 MED ORDER — MENTHOL 3 MG MT LOZG: 1.0000 | LOZENGE | OROMUCOSAL | Status: DC | PRN

## 2013-01-02 MED ORDER — QUETIAPINE FUMARATE 50 MG PO TABS
50.0000 mg | ORAL_TABLET | Freq: Every day | ORAL | Status: DC
  Administered 2013-01-02 – 2013-01-04 (×3): 50 mg via ORAL
  Filled 2013-01-02 (×5): qty 1

## 2013-01-02 MED ORDER — HYDROMORPHONE HCL PF 1 MG/ML IJ SOLN
0.2500 mg | INTRAMUSCULAR | Status: DC | PRN
  Administered 2013-01-02 (×2): 0.5 mg via INTRAVENOUS

## 2013-01-02 MED ORDER — OXYCODONE HCL 5 MG PO TABS
5.0000 mg | ORAL_TABLET | ORAL | Status: DC | PRN
  Administered 2013-01-02: 5 mg via ORAL
  Administered 2013-01-03 (×3): 10 mg via ORAL
  Filled 2013-01-02 (×2): qty 2
  Filled 2013-01-02: qty 1
  Filled 2013-01-02: qty 2

## 2013-01-02 MED ORDER — ONDANSETRON HCL 4 MG/2ML IJ SOLN: 4.0000 mg | Freq: Four times a day (QID) | INTRAMUSCULAR | Status: DC | PRN

## 2013-01-02 MED ORDER — ZOLPIDEM TARTRATE 5 MG PO TABS
5.0000 mg | ORAL_TABLET | Freq: Every evening | ORAL | Status: DC | PRN
  Administered 2013-01-02 – 2013-01-04 (×3): 5 mg via ORAL
  Filled 2013-01-02 (×3): qty 1

## 2013-01-02 MED ORDER — PROMETHAZINE HCL 25 MG/ML IJ SOLN: 6.2500 mg | INTRAMUSCULAR | Status: DC | PRN

## 2013-01-02 MED ORDER — CEFAZOLIN SODIUM-DEXTROSE 2-3 GM-% IV SOLR
2.0000 g | INTRAVENOUS | Status: AC
  Administered 2013-01-02: 2 g via INTRAVENOUS

## 2013-01-02 MED ORDER — ALUM & MAG HYDROXIDE-SIMETH 200-200-20 MG/5ML PO SUSP: 30.0000 mL | ORAL | Status: DC | PRN

## 2013-01-02 MED ORDER — ASPIRIN EC 325 MG PO TBEC
325.0000 mg | DELAYED_RELEASE_TABLET | Freq: Two times a day (BID) | ORAL | Status: DC
  Administered 2013-01-03 – 2013-01-05 (×5): 325 mg via ORAL
  Filled 2013-01-02 (×7): qty 1

## 2013-01-02 MED ORDER — MIDAZOLAM HCL 5 MG/5ML IJ SOLN
INTRAMUSCULAR | Status: DC | PRN
  Administered 2013-01-02: 2 mg via INTRAVENOUS

## 2013-01-02 MED ORDER — METHOCARBAMOL 100 MG/ML IJ SOLN: 500.0000 mg | Freq: Four times a day (QID) | INTRAMUSCULAR | Status: DC | PRN

## 2013-01-02 MED ORDER — PROPOFOL 10 MG/ML IV BOLUS
INTRAVENOUS | Status: DC | PRN
  Administered 2013-01-02: 150 mg via INTRAVENOUS
  Administered 2013-01-02: 50 mg via INTRAVENOUS

## 2013-01-02 MED ORDER — ACETAMINOPHEN 650 MG RE SUPP: 650.0000 mg | Freq: Four times a day (QID) | RECTAL | Status: DC | PRN

## 2013-01-02 MED ORDER — PHENOL 1.4 % MT LIQD: 1.0000 | OROMUCOSAL | Status: DC | PRN

## 2013-01-02 MED ORDER — 0.9 % SODIUM CHLORIDE (POUR BTL) OPTIME
TOPICAL | Status: DC | PRN
  Administered 2013-01-02: 1000 mL

## 2013-01-02 MED ORDER — FENTANYL CITRATE 0.05 MG/ML IJ SOLN
INTRAMUSCULAR | Status: DC | PRN
  Administered 2013-01-02: 50 ug via INTRAVENOUS
  Administered 2013-01-02 (×2): 100 ug via INTRAVENOUS
  Administered 2013-01-02 (×2): 50 ug via INTRAVENOUS

## 2013-01-02 MED ORDER — LACTATED RINGERS IV SOLN
INTRAVENOUS | Status: DC
  Administered 2013-01-02: 14:00:00 via INTRAVENOUS
  Administered 2013-01-02: 1000 mL via INTRAVENOUS
  Administered 2013-01-02: 15:00:00 via INTRAVENOUS

## 2013-01-02 MED ORDER — ONDANSETRON HCL 4 MG/2ML IJ SOLN
INTRAMUSCULAR | Status: DC | PRN
  Administered 2013-01-02: 4 mg via INTRAVENOUS

## 2013-01-02 MED ORDER — HYDROMORPHONE HCL PF 1 MG/ML IJ SOLN: 1.0000 mg | INTRAMUSCULAR | Status: DC | PRN

## 2013-01-02 MED ORDER — SUCCINYLCHOLINE CHLORIDE 20 MG/ML IJ SOLN
INTRAMUSCULAR | Status: DC | PRN
  Administered 2013-01-02: 100 mg via INTRAVENOUS

## 2013-01-02 MED ORDER — SODIUM CHLORIDE 0.9 % IV SOLN
INTRAVENOUS | Status: DC
  Administered 2013-01-02: 19:00:00 via INTRAVENOUS
  Administered 2013-01-03: 500 mL via INTRAVENOUS

## 2013-01-02 MED ORDER — ACETAMINOPHEN 325 MG PO TABS
650.0000 mg | ORAL_TABLET | Freq: Four times a day (QID) | ORAL | Status: DC | PRN
  Filled 2013-01-02: qty 2

## 2013-01-02 MED ORDER — SODIUM CHLORIDE 0.9 % IV SOLN
INTRAVENOUS | Status: DC | PRN
  Administered 2013-01-02: 1000 mL via INTRAMUSCULAR

## 2013-01-02 MED ORDER — HYDROMORPHONE HCL PF 1 MG/ML IJ SOLN
INTRAMUSCULAR | Status: DC | PRN
  Administered 2013-01-02 (×2): 1 mg via INTRAVENOUS

## 2013-01-02 MED ORDER — CEFAZOLIN SODIUM 1-5 GM-% IV SOLN
1.0000 g | Freq: Four times a day (QID) | INTRAVENOUS | Status: AC
  Administered 2013-01-02 – 2013-01-03 (×2): 1 g via INTRAVENOUS
  Filled 2013-01-02 (×2): qty 50

## 2013-01-02 MED ORDER — STERILE WATER FOR IRRIGATION IR SOLN
Status: DC | PRN
  Administered 2013-01-02: 3000 mL

## 2013-01-02 SURGICAL SUPPLY — 40 items
BAG ZIPLOCK 12X15 (MISCELLANEOUS) IMPLANT
BLADE SAW SGTL 18X1.27X75 (BLADE) ×2 IMPLANT
CELLS DAT CNTRL 66122 CELL SVR (MISCELLANEOUS) ×1 IMPLANT
CLOTH BEACON ORANGE TIMEOUT ST (SAFETY) ×2 IMPLANT
DERMABOND ADVANCED (GAUZE/BANDAGES/DRESSINGS) ×1
DERMABOND ADVANCED .7 DNX12 (GAUZE/BANDAGES/DRESSINGS) ×1 IMPLANT
DRAPE C-ARM 42X72 X-RAY (DRAPES) ×2 IMPLANT
DRAPE STERI IOBAN 125X83 (DRAPES) ×2 IMPLANT
DRAPE U-SHAPE 47X51 STRL (DRAPES) ×6 IMPLANT
DRSG AQUACEL AG ADV 3.5X10 (GAUZE/BANDAGES/DRESSINGS) ×2 IMPLANT
DURAPREP 26ML APPLICATOR (WOUND CARE) ×2 IMPLANT
ELECT BLADE TIP CTD 4 INCH (ELECTRODE) ×2 IMPLANT
ELECT REM PT RETURN 9FT ADLT (ELECTROSURGICAL) ×2
ELECTRODE REM PT RTRN 9FT ADLT (ELECTROSURGICAL) ×1 IMPLANT
FACESHIELD LNG OPTICON STERILE (SAFETY) ×8 IMPLANT
GLOVE BIO SURGEON STRL SZ7 (GLOVE) ×2 IMPLANT
GLOVE BIO SURGEON STRL SZ7.5 (GLOVE) ×2 IMPLANT
GLOVE BIOGEL PI IND STRL 7.5 (GLOVE) IMPLANT
GLOVE BIOGEL PI IND STRL 8 (GLOVE) ×1 IMPLANT
GLOVE BIOGEL PI INDICATOR 7.5 (GLOVE)
GLOVE BIOGEL PI INDICATOR 8 (GLOVE) ×1
GLOVE ECLIPSE 7.0 STRL STRAW (GLOVE) ×2 IMPLANT
GLOVE SURG SS PI 7.5 STRL IVOR (GLOVE) ×8 IMPLANT
GOWN STRL REIN XL XLG (GOWN DISPOSABLE) ×8 IMPLANT
HANDPIECE INTERPULSE COAX TIP (DISPOSABLE) ×1
KIT BASIN OR (CUSTOM PROCEDURE TRAY) ×2 IMPLANT
PACK TOTAL JOINT (CUSTOM PROCEDURE TRAY) ×2 IMPLANT
PADDING CAST COTTON 6X4 STRL (CAST SUPPLIES) ×2 IMPLANT
RTRCTR WOUND ALEXIS 18CM MED (MISCELLANEOUS) ×2
SET HNDPC FAN SPRY TIP SCT (DISPOSABLE) ×1 IMPLANT
SUT ETHIBOND NAB CT1 #1 30IN (SUTURE) ×4 IMPLANT
SUT ETHILON 3 0 PS 1 (SUTURE) ×2 IMPLANT
SUT MNCRL AB 4-0 PS2 18 (SUTURE) ×2 IMPLANT
SUT VIC AB 1 CT1 36 (SUTURE) ×4 IMPLANT
SUT VIC AB 2-0 CT1 27 (SUTURE) ×2
SUT VIC AB 2-0 CT1 TAPERPNT 27 (SUTURE) ×2 IMPLANT
SUT VLOC 180 0 24IN GS25 (SUTURE) ×2 IMPLANT
TOWEL OR 17X26 10 PK STRL BLUE (TOWEL DISPOSABLE) ×4 IMPLANT
TOWEL OR NON WOVEN STRL DISP B (DISPOSABLE) ×2 IMPLANT
TRAY FOLEY CATH 14FRSI W/METER (CATHETERS) ×2 IMPLANT

## 2013-01-02 NOTE — Anesthesia Postprocedure Evaluation (Signed)
  Anesthesia Post-op Note  Patient: Natasha Davila  Procedure(s) Performed: Procedure(s) (LRB): LEFT TOTAL HIP ARTHROPLASTY ANTERIOR APPROACH (Left)  Patient Location: PACU  Anesthesia Type: General  Level of Consciousness: awake and alert   Airway and Oxygen Therapy: Patient Spontanous Breathing  Post-op Pain: mild  Post-op Assessment: Post-op Vital signs reviewed, Patient's Cardiovascular Status Stable, Respiratory Function Stable, Patent Airway and No signs of Nausea or vomiting  Last Vitals:  Filed Vitals:   01/02/13 1630  BP:   Pulse: 98  Temp: 36.4 C  Resp:     Post-op Vital Signs: stable   Complications: No apparent anesthesia complications

## 2013-01-02 NOTE — Transfer of Care (Signed)
Immediate Anesthesia Transfer of Care Note  Patient: Natasha Davila  Procedure(s) Performed: Procedure(s): LEFT TOTAL HIP ARTHROPLASTY ANTERIOR APPROACH (Left)  Patient Location: PACU  Anesthesia Type:General  Level of Consciousness: awake and alert   Airway & Oxygen Therapy: Patient Spontanous Breathing and Patient connected to face mask oxygen  Post-op Assessment: Report given to PACU RN and Post -op Vital signs reviewed and stable  Post vital signs: Reviewed and stable  Complications: No apparent anesthesia complications

## 2013-01-02 NOTE — Plan of Care (Signed)
Problem: Consults Goal: Diagnosis- Total Joint Replacement Left anterior hip     

## 2013-01-02 NOTE — Anesthesia Preprocedure Evaluation (Addendum)
Anesthesia Evaluation  Patient identified by MRN, date of birth, ID band Patient awake    Reviewed: Allergy & Precautions, H&P , NPO status , Patient's Chart, lab work & pertinent test results  Airway Mallampati: II TM Distance: >3 FB Neck ROM: Full    Dental  (+) Edentulous Upper and Edentulous Lower   Pulmonary Current Smoker,  breath sounds clear to auscultation  Pulmonary exam normal       Cardiovascular hypertension, Pt. on medications Rhythm:Regular Rate:Normal     Neuro/Psych negative neurological ROS  negative psych ROS   GI/Hepatic negative GI ROS, (+) Hepatitis -, C  Endo/Other  negative endocrine ROS  Renal/GU negative Renal ROS  negative genitourinary   Musculoskeletal negative musculoskeletal ROS (+)   Abdominal   Peds negative pediatric ROS (+)  Hematology negative hematology ROS (+)   Anesthesia Other Findings   Reproductive/Obstetrics negative OB ROS                         Anesthesia Physical Anesthesia Plan  ASA: III  Anesthesia Plan: General   Post-op Pain Management:    Induction:   Airway Management Planned: Oral ETT  Additional Equipment:   Intra-op Plan:   Post-operative Plan: Extubation in OR  Informed Consent: I have reviewed the patients History and Physical, chart, labs and discussed the procedure including the risks, benefits and alternatives for the proposed anesthesia with the patient or authorized representative who has indicated his/her understanding and acceptance.   Dental advisory given  Plan Discussed with: CRNA and Surgeon  Anesthesia Plan Comments:        Anesthesia Quick Evaluation

## 2013-01-02 NOTE — H&P (Signed)
TOTAL HIP ADMISSION H&P  Patient is admitted for left total hip arthroplasty.  Subjective:  Chief Complaint: left hip pain  HPI: Natasha Davila, 62 y.o. female, has a history of pain and functional disability in the left hip(s) due to arthritis and patient has failed non-surgical conservative treatments for greater than 12 weeks to include NSAID's and/or analgesics, use of assistive devices, weight reduction as appropriate and activity modification.  Onset of symptoms was gradual starting 5 years ago with gradually worsening course since that time.The patient noted no past surgery on the left hip(s).  Patient currently rates pain in the left hip at 10 out of 10 with activity. Patient has night pain, worsening of pain with activity and weight bearing, trendelenberg gait, pain that interfers with activities of daily living, pain with passive range of motion and crepitus. Patient has evidence of subchondral cysts, subchondral sclerosis, periarticular osteophytes and joint space narrowing by imaging studies. This condition presents safety issues increasing the risk of falls.  There is no current active infection.  Patient Active Problem List   Diagnosis Date Noted  . Degenerative arthritis of hip 01/02/2013   Past Medical History  Diagnosis Date  . Arthritis   . Hypertension   . Depression   . Anxiety   . Chronic kidney disease     papillary necrosis   . Sickle cell trait   . Hepatitis     hepatitis C     Past Surgical History  Procedure Laterality Date  . Cholecystectomy  08/07/11  . Abdominal hysterectomy    . Tubal ligation      Prescriptions prior to admission  Medication Sig Dispense Refill  . HYDROcodone-acetaminophen (NORCO/VICODIN) 5-325 MG per tablet Take 1 tablet by mouth 2 (two) times daily as needed. For pain      . lisinopril (PRINIVIL,ZESTRIL) 10 MG tablet Take 10 mg by mouth at bedtime.       . mirtazapine (REMERON) 45 MG tablet Take 45 mg by mouth at bedtime.        Marland Kitchen OVER THE COUNTER MEDICATION Women's essential vitamin- one daily      . OVER THE COUNTER MEDICATION B Complex one daily      . QUEtiapine (SEROQUEL) 25 MG tablet Take 50 mg by mouth at bedtime as needed. For sleep      . zolpidem (AMBIEN) 5 MG tablet Take 5 mg by mouth at bedtime as needed. For sleep       No Known Allergies  History  Substance Use Topics  . Smoking status: Current Every Day Smoker -- 0.25 packs/day for 20 years    Types: Cigarettes  . Smokeless tobacco: Never Used  . Alcohol Use: Yes     Comment: occasional     History reviewed. No pertinent family history.   Review of Systems  Musculoskeletal: Positive for joint pain.  All other systems reviewed and are negative.    Objective:  Physical Exam  Constitutional: She is oriented to person, place, and time. She appears well-developed and well-nourished.  HENT:  Head: Normocephalic and atraumatic.  Eyes: EOM are normal. Pupils are equal, round, and reactive to light.  Neck: Normal range of motion. Neck supple.  Cardiovascular: Normal rate and regular rhythm.   Respiratory: Effort normal and breath sounds normal.  GI: Soft. Bowel sounds are normal.  Musculoskeletal:       Left hip: She exhibits decreased range of motion, decreased strength, bony tenderness and crepitus.  Neurological: She is alert and  oriented to person, place, and time.  Skin: Skin is warm and dry.  Psychiatric: She has a normal mood and affect.    Vital signs in last 24 hours: Temp:  [97.8 F (36.6 C)] 97.8 F (36.6 C) (03/14 1250) Pulse Rate:  [98] 98 (03/14 1250) Resp:  [16] 16 (03/14 1250) BP: (159)/(99) 159/99 mmHg (03/14 1250) SpO2:  [95 %] 95 % (03/14 1250)  Labs:   Estimated body mass index is 22.27 kg/(m^2) as calculated from the following:   Height as of 12/29/12: 5\' 7"  (1.702 m).   Weight as of 08/28/11: 64.524 kg (142 lb 4 oz).   Imaging Review Plain radiographs demonstrate severe degenerative joint disease of the  left hip(s). The bone quality appears to be good for age and reported activity level.  Assessment/Plan:  End stage arthritis, left hip(s)  The patient history, physical examination, clinical judgement of the provider and imaging studies are consistent with end stage degenerative joint disease of the left hip(s) and total hip arthroplasty is deemed medically necessary. The treatment options including medical management, injection therapy, arthroscopy and arthroplasty were discussed at length. The risks and benefits of total hip arthroplasty were presented and reviewed. The risks due to aseptic loosening, infection, stiffness, dislocation/subluxation,  thromboembolic complications and other imponderables were discussed.  The patient acknowledged the explanation, agreed to proceed with the plan and consent was signed. Patient is being admitted for inpatient treatment for surgery, pain control, PT, OT, prophylactic antibiotics, VTE prophylaxis, progressive ambulation and ADL's and discharge planning.The patient is planning to be discharged to skilled nursing facility

## 2013-01-02 NOTE — Brief Op Note (Signed)
01/02/2013  4:11 PM  PATIENT:  Jerald Kief Marsala  62 y.o. female  PRE-OPERATIVE DIAGNOSIS:  Severe osteoarthritis left hip  POST-OPERATIVE DIAGNOSIS:  Severe osteoarthritis left hip  PROCEDURE:  Procedure(s): LEFT TOTAL HIP ARTHROPLASTY ANTERIOR APPROACH (Left)  SURGEON:  Surgeon(s) and Role:    * Kathryne Hitch, MD - Primary  PHYSICIAN ASSISTANT: Rexene Edison, PA-C   ANESTHESIA:   general  EBL:  Total I/O In: 1000 [I.V.:1000] Out: 450 [Urine:200; Blood:250]  BLOOD ADMINISTERED:none  DRAINS: none   LOCAL MEDICATIONS USED:  NONE  SPECIMEN:  No Specimen  DISPOSITION OF SPECIMEN:  N/A  COUNTS:  YES  TOURNIQUET:  * No tourniquets in log *  DICTATION: .Other Dictation: Dictation Number [id number]  PLAN OF CARE: Admit to inpatient   PATIENT DISPOSITION:  PACU - hemodynamically stable.   Delay start of Pharmacological VTE agent (>24hrs) due to surgical blood loss or risk of bleeding: no

## 2013-01-03 LAB — CBC
HCT: 30.4 % — ABNORMAL LOW (ref 36.0–46.0)
Hemoglobin: 10.3 g/dL — ABNORMAL LOW (ref 12.0–15.0)
MCH: 31.2 pg (ref 26.0–34.0)
MCHC: 33.9 g/dL (ref 30.0–36.0)

## 2013-01-03 LAB — BASIC METABOLIC PANEL
BUN: 12 mg/dL (ref 6–23)
CO2: 22 mEq/L (ref 19–32)
Calcium: 8.3 mg/dL — ABNORMAL LOW (ref 8.4–10.5)
GFR calc non Af Amer: 43 mL/min — ABNORMAL LOW (ref >90)
Glucose, Bld: 131 mg/dL — ABNORMAL HIGH (ref 70–99)

## 2013-01-03 MED ORDER — HYDROCODONE-ACETAMINOPHEN 5-325 MG PO TABS
1.0000 | ORAL_TABLET | ORAL | Status: DC | PRN
  Administered 2013-01-03 – 2013-01-04 (×2): 1 via ORAL
  Administered 2013-01-04 – 2013-01-05 (×4): 2 via ORAL
  Filled 2013-01-03 (×2): qty 2
  Filled 2013-01-03 (×2): qty 1
  Filled 2013-01-03: qty 2
  Filled 2013-01-03: qty 1
  Filled 2013-01-03: qty 2

## 2013-01-03 NOTE — Op Note (Signed)
Natasha Davila, Natasha Davila            ACCOUNT NO.:  [id number]  MEDICAL RECORD NO.:  [id number]  LOCATION:  1606                         FACILITY:  [facility]  PHYSICIAN:  Vanita Panda. Magnus Ivan, M.D.DATE OF BIRTH:  24-May-1951  DATE OF PROCEDURE:  01/02/2013 DATE OF DISCHARGE:                              OPERATIVE REPORT   PREOPERATIVE DIAGNOSES:  Severe end-stage arthritis and degenerative joint disease, left hip.  POSTOPERATIVE DIAGNOSES:  Severe end-stage arthritis and degenerative joint disease, left hip.  PROCEDURE:  Left total hip arthroplasty through direct anterior approach.  IMPLANTS:  DePuy Sector Gription acetabular component size 48, size 32+ 4 neutral polyethylene liner, size 10 Corail femoral component with standard offset, size 32+ 1 ceramic hip ball.  SURGEON:  Doneen Poisson, MD  ASSISTANT: Richardean Canal, PA-C  ANESTHESIA:  General.  ANTIBIOTICS:  2 g IV Ancef.  BLOOD LOSS:  250 mL.  COMPLICATIONS:  None.  INDICATIONS:  Ms. Pasqual is a 62 year old female with known end-stage arthritis involving her left hip.  She has x-rays that show complete loss of her joint space, severe significant periarticular osteophytes, subchondral sclerosis, and subchondral cysts on both the socket and femoral head side.  She has failed conservative treatments and at this point wished to proceed with a total hip arthroplasty due to her daily pain and poor impact it has had on her activities of daily living and her quality of life.  She understands her goals are increased mobility, decreased pain, and improved quality of life.  She also understands the risk of acute blood loss anemia, DVT, infection, nerve or vessel injury, and PE.  PROCEDURE DESCRIPTION:  After informed consent was obtained, appropriate left hip was marked.  She was brought to the operating room and general anesthesia was obtained while she was on her stretcher.  Foley catheter was placed and both feet  had traction boots applied to them.  She was then placed supine on the Hana fracture table.  The perineal post in place and both legs in inline skeletal traction but no traction applied. Operative hip was then prepped and draped with DuraPrep and sterile drapes.  Time-out was called.  She was identified as correct patient, correct left hip.  We then made an incision just posterior and anterior to the anterior superior iliac spine and carried this slightly obliquely down the leg.  We dissected down to the tensor fascia lata and tensor fascia was divided longitudinally.  We then proceeded with a direct anterior approach to the hip.  A Cobra retractor was placed around the lateral neck and up underneath the rectus femoris, medial retractors were placed.  We cauterized the lateral femoral circumflex vessels and then made our capsular incision in an L-shaped format placing the Cobra retractors within the capsular incision.  We then made our femoral neck cut proximal to the lesser trochanter with an oscillating saw and finished this off with an osteotome.  We then removed the femoral head in its entirety and was found to be devoid of cartilage.  We cleaned significant debris from within the acetabulum and placed the Bent Hohmann medially and a Cobra retractor laterally.  I then began reaming in 2 mm increments from size 42  all the way up to size 48 with all reamers placed under direct visualization.  Last reamer also placed under direct fluoroscopy, so we could obtain our depth of reaming, our inclination, and anteversion.  Once we are pleased with this, we placed the real DePuy Sector Gription acetabular component size 48 in the apex hole eliminator guide.  We then placed the 32+ 4 neutral polyethylene liner.  Attention was then turned to the femur.  With the leg externally rotated to 90 degrees, extended and adducted.  We placed a Mueller retractor medially and a Hohmann retractor behind the  greater trochanter.  I released the lateral capsule and brought the leg up prior.  We then used a box cutting guide and a rongeur to enter the femoral canal and began broaching with a size 8 broach up to a size 10 broach.  With the 10 broach, we placed the calcar planer and then I trialed a standard neck with a 32+ 1 hip ball about the leg back up and over and with traction and internal rotation reduced the hip into the pelvis.  We assessed the rotation, it was stable.  We adjusted leg lengths and positioning under direct fluoroscopy and we are pleased with these.  We then dislocated the hip again and removed the trial components.  We placed the real Corail femoral component from DePuy size 10 with standard offset and the real 32+ 1 ceramic hip ball.  Again, we brought the leg back up and over and with traction and internal rotation, reduced the hip and it was stable.  We then copiously irrigated the soft tissues with normal saline solution using pulsatile lavage.  We closed the joint capsule with interrupted #1 Ethibond suture followed by a running 0 V-Loc to the tensor fascia, 0 Vicryl deep, 2-0 Vicryl subcutaneous, and 4-0 Monocryl subcuticular stitch.  A Silvercel dressing was applied.  She was taken off the Hana table, awakened, extubated, and taken to recovery room in stable condition.  All final counts were correct and there were no complications noted.     Vanita Panda. Magnus Ivan, M.D.     CYB/MEDQ  D:  01/02/2013  T:  01/03/2013  Job:  [id number]

## 2013-01-03 NOTE — Progress Notes (Signed)
Patient ID: Natasha Davila, female   DOB: August 26, 1951, 62 y.o.   MRN: [id number] Patient is postoperative day 1 total hip arthroplasty. Neurovascularly intact. Hemoglobin 10.3. Plan for discharge to skilled nursing.

## 2013-01-03 NOTE — Evaluation (Signed)
Physical Therapy Evaluation Patient Details Name: Natasha Davila MRN: [id number] DOB: August 18, 1951 Today's Date: 01/03/2013 Time: 0940-1000 PT Time Calculation (min): 20 min  PT Assessment / Plan / Recommendation Clinical Impression  62 yo female s/p L THA-direct anterior. On eval, pt required Min-Mod assist for mobility. Pt reports moderate-severe pain with activity. Plan is for rehab. Recommend SNF to improve strength, activity tolerance, ROM and gait in order to maximize independence and safety with functional mobility.     PT Assessment  Patient needs continued PT services    Follow Up Recommendations  SNF    Does the patient have the potential to tolerate intense rehabilitation      Barriers to Discharge        Equipment Recommendations  None recommended by PT    Recommendations for Other Services OT consult   Frequency 7X/week    Precautions / Restrictions Precautions Precautions: Fall Restrictions Weight Bearing Restrictions: No LLE Weight Bearing: Weight bearing as tolerated   Pertinent Vitals/Pain 9/10 L hip      Mobility  Bed Mobility Bed Mobility: Supine to Sit Supine to Sit: 4: Min assist;HOB elevated Details for Bed Mobility Assistance: Pt insisted on having pants on before proceeding with session. Increased time. VCs safety, technique, hand placement. Assist for L LE off bed.  Transfers Transfers: Sit to Stand;Stand to [employer] Transfers Sit to Stand: 3: Mod assist;From bed;With upper extremity assist Stand to Sit: 4: Min assist;To chair/3-in-1;With upper extremity assist;With armrests Details for Transfer Assistance: VCS safety, technique, hand placement. Assist to rise, stabilize, control descent, maneuver with RW Ambulation/Gait Ambulation/Gait Assistance: 4: Min assist Ambulation Distance (Feet): 5 Feet Assistive device: Rolling walker Ambulation/Gait Assistance Details: 5 feet forwards then backwards. VCS safety, technique, sequence.  Assist to stabilize throughout ambulation. O2 sats 81% on RA during ambulation, HR 115 bpm.  Gait Pattern: Trunk flexed;Antalgic;Decreased stride length;Decreased step length - left;Step-to pattern    Exercises Total Joint Exercises Ankle Circles/Pumps: AROM;5 reps;Seated   PT Diagnosis: Difficulty walking;Abnormality of gait;Acute pain  PT Problem List: Decreased strength;Decreased range of motion;Decreased activity tolerance;Decreased mobility;Decreased knowledge of use of DME;Pain PT Treatment Interventions: DME instruction;Gait training;Functional mobility training;Therapeutic activities;Therapeutic exercise;Patient/family education   PT Goals Acute Rehab PT Goals PT Goal Formulation: With patient Time For Goal Achievement: 01/10/13 Potential to Achieve Goals: Good Pt will go Supine/Side to Sit: with supervision PT Goal: Supine/Side to Sit - Progress: Goal set today Pt will go Sit to Supine/Side: with supervision PT Goal: Sit to Supine/Side - Progress: Goal set today Pt will go Sit to Stand: with supervision PT Goal: Sit to Stand - Progress: Goal set today Pt will Ambulate: 51 - 150 feet;with supervision;with rolling walker PT Goal: Ambulate - Progress: Goal set today Pt will Perform Home Exercise Program: with supervision, verbal cues required/provided PT Goal: Perform Home Exercise Program - Progress: Goal set today  Visit Information  Last PT Received On: 01/03/13 Assistance Needed: +1    Subjective Data  Subjective: It just hurts Patient Stated Goal: less pain. rehab   Prior Functioning  Home Living Lives With: Alone Home Adaptive Equipment: Walker - rolling Additional Comments: pt planning to d/c to rehab Prior Function Level of Independence: Independent with assistive device(s) Comments: using walker just prior to surgery Communication Communication: No difficulties    Cognition  Cognition Overall Cognitive Status: Appears within functional limits for tasks  assessed/performed Arousal/Alertness: Awake/alert Orientation Level: Appears intact for tasks assessed Behavior During Session: [facility] for tasks performed  Extremity/Trunk Assessment Right Lower Extremity Assessment RLE ROM/Strength/Tone: WFL for tasks assessed Left Lower Extremity Assessment LLE ROM/Strength/Tone: Deficits LLE ROM/Strength/Tone Deficits: hip flex 2/5, hip abd/add 2/5, moves ankle Trunk Assessment Trunk Assessment: Normal   Balance    End of Session PT - End of Session Equipment Utilized During Treatment: Gait belt Activity Tolerance: Patient limited by pain Patient left: in chair;with call bell/phone within reach  GP     Rebeca Alert, MPT Pager: [phone]

## 2013-01-03 NOTE — Progress Notes (Addendum)
Physical Therapy Treatment Patient Details Name: Natasha Davila MRN: [id number] DOB: 06/14/1951 Today's Date: 01/03/2013 Time: 1478-2956 PT Time Calculation (min): 28 min  PT Assessment / Plan / Recommendation Comments on Treatment Session  Progressing slowly. Recommend SNF.     Follow Up Recommendations  SNF     Does the patient have the potential to tolerate intense rehabilitation     Barriers to Discharge        Equipment Recommendations  None recommended by PT    Recommendations for Other Services OT consult  Frequency 7X/week   Plan Discharge plan remains appropriate    Precautions / Restrictions Precautions Precautions: Fall Restrictions Weight Bearing Restrictions: No LLE Weight Bearing: Weight bearing as tolerated   Pertinent Vitals/Pain 7/10 L hip. O2 sats 97% on RA, HR 111bpm at end of session    Mobility  Bed Mobility Bed Mobility: Supine to Sit;Sit to Supine Supine to Sit: 4: Min assist Sit to Supine: 4: Min assist Details for Bed Mobility Assistance:  Increased time. VCs safety, technique, hand placement. Assist for L LE off/onto bed.  Transfers Transfers: Sit to Stand;Stand to Sit Sit to Stand: 4: Min assist;From bed;From elevated surface Stand to Sit: 4: Min assist;To bed;With upper extremity assist Details for Transfer Assistance: VCS safety, technique, hand placement. Assist to rise, stabilize, control descent.  Ambulation/Gait Ambulation/Gait Assistance: 4: Min assist Ambulation Distance (Feet): 25 Feet Assistive device: Rolling walker Ambulation/Gait Assistance Details: VCS safety, technique, posture, step length, sequence. Slow gait speed. Fatigues fairly easily.  Gait Pattern: Trunk flexed;Antalgic;Decreased stride length;Decreased step length - left;Step-to pattern    Exercises Total Joint Exercises Ankle Circles/Pumps: AROM;Both;10 reps;Supine Quad Sets: AROM;Both;10 reps;Supine Short Arc Quad: AROM;Left;10 reps;Supine Heel Slides:  AAROM;Left;10 reps;Supine Hip ABduction/ADduction: AAROM;Left;10 reps;Supine   PT Diagnosis: Difficulty walking;Abnormality of gait;Acute pain  PT Problem List: Decreased strength;Decreased range of motion;Decreased activity tolerance;Decreased mobility;Decreased knowledge of use of DME;Pain PT Treatment Interventions: DME instruction;Gait training;Functional mobility training;Therapeutic activities;Therapeutic exercise;Patient/family education   PT Goals Acute Rehab PT Goals PT Goal Formulation: With patient Time For Goal Achievement: 01/10/13 Potential to Achieve Goals: Good Pt will go Supine/Side to Sit: with supervision PT Goal: Supine/Side to Sit - Progress: Progressing toward goal Pt will go Sit to Supine/Side: with supervision PT Goal: Sit to Supine/Side - Progress: Progressing toward goal Pt will go Sit to Stand: with supervision PT Goal: Sit to Stand - Progress: Progressing toward goal Pt will Ambulate: 51 - 150 feet;with supervision;with rolling walker PT Goal: Ambulate - Progress: Progressing toward goal Pt will Perform Home Exercise Program: with supervision, verbal cues required/provided PT Goal: Perform Home Exercise Program - Progress: Progressing toward goal  Visit Information  Last PT Received On: 01/03/13 Assistance Needed: +1    Subjective Data  Subjective: My pain is not any better Patient Stated Goal: less pain. rehab   Cognition       Balance     End of Session PT - End of Session Equipment Utilized During Treatment: Gait belt Activity Tolerance: Patient limited by pain;Patient limited by fatigue Patient left: in bed;with call bell/phone within reach;with family/visitor present   GP     Rebeca Alert, MPT Pager: [phone]

## 2013-01-04 LAB — CBC
Hemoglobin: 9.9 g/dL — ABNORMAL LOW (ref 12.0–15.0)
MCH: 31.6 pg (ref 26.0–34.0)
Platelets: 286 10*3/uL (ref 150–400)
RBC: 3.13 MIL/uL — ABNORMAL LOW (ref 3.87–5.11)
WBC: 8.4 10*3/uL (ref 4.0–10.5)

## 2013-01-04 MED ORDER — FERROUS SULFATE 325 (65 FE) MG PO TABS: 325.0000 mg | ORAL_TABLET | Freq: Three times a day (TID) | ORAL | Status: DC

## 2013-01-04 MED ORDER — METHOCARBAMOL 500 MG PO TABS: 500.0000 mg | ORAL_TABLET | Freq: Four times a day (QID) | ORAL | Status: DC | PRN

## 2013-01-04 MED ORDER — HYDROCODONE-ACETAMINOPHEN 5-325 MG PO TABS: 1.0000 | ORAL_TABLET | ORAL | Status: DC | PRN

## 2013-01-04 MED ORDER — ASPIRIN 325 MG PO TBEC: 325.0000 mg | DELAYED_RELEASE_TABLET | Freq: Two times a day (BID) | ORAL | Status: DC

## 2013-01-04 NOTE — Evaluation (Signed)
Occupational Therapy Evaluation Patient Details Name: Natasha Davila MRN: [id number] DOB: [birth year] Today's Date: 01/04/2013 Time: 7829-5621 OT Time Calculation (min): 27 min  OT Assessment / Plan / Recommendation Clinical Impression  Pt is a pleasant and motivated 62 yr old female admitted for elective left THA.  Pt overall presents at a min assist level for selfcare tasks and will benefit from acute care OT to help increase overall independence and safety with ADLs.  Feel pt will need SNF for follow-up rehab secondary to not have any help at home.    OT Assessment  Patient needs continued OT Services    Follow Up Recommendations  SNF    Barriers to Discharge Decreased caregiver support    Equipment Recommendations  3 in 1 bedside comode       Frequency  Min 2X/week    Precautions / Restrictions Precautions Precautions: Fall Restrictions Weight Bearing Restrictions: No LLE Weight Bearing: Weight bearing as tolerated   Pertinent Vitals/Pain Pain 5/10 in the left hip, meds given just prior to therapy session   ADL  Eating/Feeding: Simulated;Independent Where Assessed - Eating/Feeding: Chair Grooming: Simulated;Min guard Where Assessed - Grooming: Supported standing Upper Body Bathing: Simulated;Set up Where Assessed - Upper Body Bathing: Unsupported sitting Lower Body Bathing: Simulated;Minimal assistance Where Assessed - Lower Body Bathing: Supported sit to stand Upper Body Dressing: Simulated;Set up Where Assessed - Upper Body Dressing: Unsupported sitting Lower Body Dressing: Performed;Minimal assistance (use of reacher and sockaide) Where Assessed - Lower Body Dressing: Supported sit to stand Toilet Transfer: Simulated;Minimal assistance Toilet Transfer Method: Other (comment) (ambulate with RW) Acupuncturist: Bedside commode Toileting - Clothing Manipulation and Hygiene: Simulated;Minimal assistance Where Assessed - Engineer, mining  and Hygiene: Sit to stand from 3-in-1 or toilet Tub/Shower Transfer Method: Not assessed Equipment Used: Rolling walker Transfers/Ambulation Related to ADLs: Pt able to ambulate with min guard assist using the RW.  Needs cueing to remain in upright posture and not push the RW too far out in front of her. ADL Comments: Began education on use of AE for LB selfcare tasks.  Since pt lives alone and is currently min assist, she will need short term SNF for rehab.    OT Diagnosis: Generalized weakness;Acute pain  OT Problem List: Decreased strength;Decreased activity tolerance;Impaired balance (sitting and/or standing);Decreased knowledge of use of DME or AE;Pain OT Treatment Interventions: Self-care/ADL training;Therapeutic activities;Patient/family education;DME and/or AE instruction;Balance training   OT Goals Acute Rehab OT Goals OT Goal Formulation: With patient Time For Goal Achievement: 01/11/13 Potential to Achieve Goals: Good ADL Goals Pt Will Perform Grooming: with supervision;Standing at sink ADL Goal: Grooming - Progress: Goal set today Pt Will Perform Lower Body Bathing: with supervision;Sit to stand from bed;with adaptive equipment ADL Goal: Lower Body Bathing - Progress: Goal set today Pt Will Perform Lower Body Dressing: with supervision;Sit to stand from bed;with adaptive equipment ADL Goal: Lower Body Dressing - Progress: Goal set today Pt Will Transfer to Toilet: with supervision;with DME;3-in-1 ADL Goal: Toilet Transfer - Progress: Goal set today Pt Will Perform Toileting - Clothing Manipulation: with supervision;Sitting on 3-in-1 or toilet;Standing ADL Goal: Toileting - Clothing Manipulation - Progress: Goal set today Pt Will Perform Toileting - Hygiene: with supervision;Sit to stand from 3-in-1/toilet ADL Goal: Toileting - Hygiene - Progress: Goal set today  Visit Information  Last OT Received On: 01/04/13 Assistance Needed: +1    Subjective Data  Subjective: I want  to do as much as I can for myself. Patient Stated  Goal: Pt wants to be as independent as possible.   Prior Functioning     Home Living Lives With: Alone Bathroom Shower/Tub: Engineer, manufacturing systems: [employer] Home Adaptive Equipment: Walker - rolling Additional Comments: pt planning to d/c to rehab Prior Function Level of Independence: Independent with assistive device(s) Communication Communication: No difficulties         Vision/Perception Vision - History Baseline Vision: No visual deficits Patient Visual Report: No change from baseline Vision - Assessment Eye Alignment: Within Functional Limits Perception Perception: Within Functional Limits Praxis Praxis: Intact   Cognition  Cognition Overall Cognitive Status: Appears within functional limits for tasks assessed/performed Arousal/Alertness: Awake/alert Orientation Level: Appears intact for tasks assessed Behavior During Session: [facility] for tasks performed    Extremity/Trunk Assessment Right Upper Extremity Assessment RUE ROM/Strength/Tone: Within functional levels RUE Sensation: WFL - Light Touch RUE Coordination: WFL - gross/fine motor Left Upper Extremity Assessment LUE ROM/Strength/Tone: Within functional levels LUE Sensation: WFL - Light Touch LUE Coordination: WFL - gross/fine motor Trunk Assessment Trunk Assessment: Normal     Mobility Bed Mobility Bed Mobility: Supine to Sit Supine to Sit: HOB elevated;4: Min assist Details for Bed Mobility Assistance: pt up walking in room with OT at start of tx Transfers Transfers: Sit to Stand Sit to Stand: 4: Min assist;With upper extremity assist Stand to Sit: 4: Min assist;With upper extremity assist Details for Transfer Assistance: VCS safety, technique, hand placement.         Balance Balance Balance Assessed: Yes Dynamic Standing Balance Dynamic Standing - Balance Support: Right upper extremity supported;Left upper extremity supported Dynamic  Standing - Level of Assistance: 4: Min assist   End of Session OT - End of Session Activity Tolerance: Patient tolerated treatment well Patient left:  (with PT for mobility) Nurse Communication: Mobility status     Kriti Katayama OTR/L Pager number [phone] 01/04/2013, 11:40 AM

## 2013-01-04 NOTE — Progress Notes (Signed)
Clinical Social Work Department BRIEF PSYCHOSOCIAL ASSESSMENT 01/04/2013  Patient:  Natasha Davila, Natasha Davila     Account Number:  [id number]     Admit date:  01/02/2013  Clinical Social Worker:  Doroteo Glassman  Date/Time:  01/04/2013 10:32 AM  Referred by:  Physician  Date Referred:  01/04/2013 Referred for  SNF Placement   Other Referral:   Interview type:  Patient Other interview type:    PSYCHOSOCIAL DATA Living Status:  ALONE Admitted from facility:   Level of care:   Primary support name:  Margareta Laureano Primary support relationship to patient:  CHILD, ADULT Degree of support available:   unknown    CURRENT CONCERNS Current Concerns  Post-Acute Placement   Other Concerns:    SOCIAL WORK ASSESSMENT / PLAN CSW met with Pt to discuss d/c plans.    Pt reported that she lives alone and that she understands the need for SNF.  Pt agreeable to SNF search.    CSW provided Pt with SNF list and stated that unit CSW will provide Pt with bed offers, once received.    CSW thanked Pt for her time.   Assessment/plan status:  Psychosocial Support/Ongoing Assessment of Needs Other assessment/ plan:   Information/referral to community resources:   [facility] SNF list    PATIENT'S/FAMILY'S RESPONSE TO PLAN OF CARE: Pt thanked CSW for time and assistance.   CSW to continue to follow.  Providence Crosby, LCSWA Clinical Social Work [phone]

## 2013-01-04 NOTE — Progress Notes (Signed)
Physical Therapy Treatment Patient Details Name: Natasha Davila MRN: [id number] DOB: Nov 24, 1950 Today's Date: 01/04/2013 Time: 4098-1191 PT Time Calculation (min): 23 min  PT Assessment / Plan / Recommendation Comments on Treatment Session  Progressing. Ambulated 75' with RW.  Recommend SNF.     Follow Up Recommendations  SNF     Does the patient have the potential to tolerate intense rehabilitation     Barriers to Discharge        Equipment Recommendations  None recommended by PT    Recommendations for Other Services OT consult  Frequency 7X/week   Plan Discharge plan remains appropriate    Precautions / Restrictions Precautions Precautions: Fall Restrictions Weight Bearing Restrictions: No LLE Weight Bearing: Weight bearing as tolerated   Pertinent Vitals/Pain *pain 5/10 L hip with walking, ice applied, pt premedicated SaO2 91% on RA walking, HR 117**    Mobility  Bed Mobility Bed Mobility: Not assessed Details for Bed Mobility Assistance: pt up walking in room with OT at start of tx Transfers Transfers: Stand to Sit Stand to Sit: 4: Min assist;With upper extremity assist;To chair/3-in-1;With armrests Details for Transfer Assistance: VCS safety, technique, hand placement.  Ambulation/Gait Ambulation/Gait Assistance: 4: Min guard Ambulation Distance (Feet): 55 Feet Assistive device: Rolling walker Gait Pattern: Trunk flexed;Antalgic;Decreased stride length;Decreased step length - left;Step-to pattern Gait velocity: decreased General Gait Details: VCs for posture, positioning in RW    Exercises Total Joint Exercises Ankle Circles/Pumps: AROM;Both;10 reps;Supine Quad Sets: AROM;Both;10 reps;Supine Short Arc Quad: AROM;Left;10 reps;Supine Heel Slides: AAROM;Left;10 reps;Supine Hip ABduction/ADduction: AAROM;Left;10 reps;Supine   PT Diagnosis:    PT Problem List:   PT Treatment Interventions:     PT Goals Acute Rehab PT Goals Pt will go Supine/Side to  Sit: with supervision Pt will go Sit to Supine/Side: with supervision Pt will go Sit to Stand: with supervision Pt will Ambulate: 51 - 150 feet;with supervision;with rolling walker PT Goal: Ambulate - Progress: Progressing toward goal Pt will Perform Home Exercise Program: with supervision, verbal cues required/provided PT Goal: Perform Home Exercise Program - Progress: Progressing toward goal  Visit Information  Last PT Received On: 01/04/13 Assistance Needed: +1    Subjective Data  Subjective: When will it stop hurting? Patient Stated Goal: less pain. rehab   Cognition  Cognition Overall Cognitive Status: Appears within functional limits for tasks assessed/performed Arousal/Alertness: Awake/alert Orientation Level: Appears intact for tasks assessed Behavior During Session: [facility] for tasks performed    Balance     End of Session PT - End of Session Activity Tolerance: Patient limited by pain;Patient limited by fatigue Patient left: with call bell/phone within reach;with family/visitor present;in chair Nurse Communication: Mobility status   GP     Tamala Ser 01/04/2013, 9:39 AM [phone]

## 2013-01-04 NOTE — Progress Notes (Signed)
Physical Therapy Treatment Patient Details Name: Natasha Davila MRN: [id number] DOB: Nov 19, 1950 Today's Date: 01/04/2013 Time: 4098-1191 PT Time Calculation (min): 24 min  PT Assessment / Plan / Recommendation Comments on Treatment Session  Progressing gradually. Ambulated 90' with RW.  Recommend SNF.     Follow Up Recommendations  SNF     Does the patient have the potential to tolerate intense rehabilitation     Barriers to Discharge        Equipment Recommendations  None recommended by PT    Recommendations for Other Services OT consult  Frequency 7X/week   Plan Discharge plan remains appropriate    Precautions / Restrictions Precautions Precautions: Fall Restrictions Weight Bearing Restrictions: No LLE Weight Bearing: Weight bearing as tolerated   Pertinent Vitals/Pain *8/10 L hip Pain meds requested, ice applied**    Mobility  Bed Mobility Bed Mobility: Supine to Sit Supine to Sit: 4: Min assist Sit to Supine: 4: Min assist Details for Bed Mobility Assistance: for LLE Transfers Transfers: Sit to Stand;Stand to Sit Sit to Stand: 4: Min guard;From bed;With upper extremity assist Stand to Sit: 4: Min guard;To bed;With upper extremity assist Ambulation/Gait Ambulation/Gait Assistance: 5: Supervision Ambulation Distance (Feet): 65 Feet Assistive device: Rolling walker Gait Pattern: Step-to pattern;Trunk flexed Gait velocity: decreased General Gait Details: VCs for posture, positioning in RW    Exercises Total Joint Exercises Ankle Circles/Pumps: AROM;Both;10 reps;Supine Quad Sets: AROM;Both;10 reps;Supine Short Arc Quad: AROM;Left;10 reps;Supine Heel Slides: AAROM;Left;10 reps;Supine Hip ABduction/ADduction: AAROM;Left;10 reps;Supine   PT Diagnosis:    PT Problem List:   PT Treatment Interventions:     PT Goals Acute Rehab PT Goals Pt will go Supine/Side to Sit: with supervision PT Goal: Supine/Side to Sit - Progress: Progressing toward goal Pt  will go Sit to Supine/Side: with supervision PT Goal: Sit to Supine/Side - Progress: Progressing toward goal Pt will go Sit to Stand: with supervision PT Goal: Sit to Stand - Progress: Progressing toward goal Pt will Ambulate: 51 - 150 feet;with supervision;with rolling walker PT Goal: Ambulate - Progress: Progressing toward goal Pt will Perform Home Exercise Program: with supervision, verbal cues required/provided PT Goal: Perform Home Exercise Program - Progress: Progressing toward goal  Visit Information  Last PT Received On: 01/04/13 Assistance Needed: +1    Subjective Data  Subjective: It's really hurting.  Patient Stated Goal: less pain. rehab   Cognition  Cognition Overall Cognitive Status: Appears within functional limits for tasks assessed/performed Arousal/Alertness: Awake/alert Orientation Level: Appears intact for tasks assessed Behavior During Session: [facility] for tasks performed    Balance  Balance Balance Assessed: Yes Dynamic Standing Balance Dynamic Standing - Balance Support: Right upper extremity supported;Left upper extremity supported Dynamic Standing - Level of Assistance: 4: Min assist  End of Session PT - End of Session Activity Tolerance: Patient limited by pain;Patient limited by fatigue Patient left: with call bell/phone within reach;in bed Nurse Communication: Mobility status   GP     Ralene Bathe Kistler 01/04/2013, 2:45 PM [phone]

## 2013-01-04 NOTE — Clinical Documentation Improvement (Signed)
     Anemia Blood Loss Clarification  THIS DOCUMENT IS NOT A PERMANENT PART OF THE MEDICAL RECORD  RESPOND TO THE THIS QUERY, FOLLOW THE INSTRUCTIONS BELOW:  1. If needed, update documentation for the patient's encounter via the notes activity.  2. Access this query again and click edit on the In [employer].  3. After updating, or not, click F2 to complete all highlighted (required) fields concerning your review. Select "additional documentation in the medical record" OR "no additional documentation provided".  4. Click Sign note button.  5. The deficiency will fall out of your In Basket *Please let us know if you are not able to complete this workflow by phone or e-mail (listed below).        01/04/13  Dear Dr. Magnus Ivan, Salena Saner Marton Redwood  In an effort to better capture your patient's severity of illness, reflect appropriate length of stay and utilization of resources, a review of the patient medical record has revealed the following indicators.    Based on your clinical judgment, please clarify and document in a progress note and/or discharge summary the clinical condition associated with the following supporting information:  In responding to this query please exercise your independent judgment.  The fact that a query is asked, does not imply that any particular answer is desired or expected  Pt with Post OP H/H=9.9/28.9  Clarification Needed    Please clarify if post op H/H=8.9/28/9 can be further specified as one of the diagnoses listed below and document in pn or d/c summary.     Possible Clinical Conditions?   " Expected Acute Blood Loss Anemia  " Acute Blood Loss Anemia  " Acute on chronic blood loss anemia  " Other Condition________________  " Cannot Clinically Determine    Risk Factors:  End stage Arthritis and L THA  Supporting Information:  Signs and Symptoms (unable to ambulate, weakness, dizziness, unable to participate in  care)  Diagnostics: Component     Latest Ref Rng 01/03/2013 01/04/2013  Hemoglobin     12.0 - 15.0 g/dL 16.1 (L) 9.9 (L)  HCT     36.0 - 46.0 % 30.4 (L) 28.9 (L)   Treatments: ferrous sulfate tablet 325 mg      Reviewed: additional documentation in the medical record  Thank You,  Enis Slipper  RN, BSN, MSN/Inf, CCDS Clinical Documentation Specialist [facility] Dept Pager: [phone] / E-mail: [email or web address]  Health Information Management [place]

## 2013-01-04 NOTE — Progress Notes (Addendum)
Clinical Social Work Department CLINICAL SOCIAL WORK PLACEMENT NOTE 01/04/2013  Patient:  Natasha Davila, Natasha Davila  Account Number:  [id number] Admit date:  01/02/2013  Clinical Social Worker:  Doroteo Glassman  Date/time:  01/04/2013 10:35 AM  Clinical Social Work is seeking post-discharge placement for this patient at the following level of care:   SKILLED NURSING   (*CSW will update this form in Epic as items are completed)   01/04/2013  Patient/family provided with [facility] Department of Clinical Social Work's list of facilities offering this level of care within the geographic area requested by the patient (or if unable, by the patient's family).  01/04/2013  Patient/family informed of their freedom to choose among providers that offer the needed level of care, that participate in Medicare, Medicaid or managed care program needed by the patient, have an available bed and are willing to accept the patient.  01/04/2013  Patient/family informed of [facility]' ownership interest in [facility], as well as of the fact that they are under no obligation to receive care at this facility.  PASARR submitted to EDS on 01/04/2013 PASARR number received from EDS on 01/04/2013  FL2 transmitted to all facilities in geographic area requested by pt/family on  01/04/2013 FL2 transmitted to all facilities within larger geographic area on   Patient informed that his/her managed care company has contracts with or will negotiate with  certain facilities, including the following:     Patient/family informed of bed offers received:   Patient chooses bed at  Physician recommends and patient chooses bed at    Patient to be transferred to  on   Patient to be transferred to facility by   The following physician request were entered in Epic:   Additional Comments:  Clinical Social Work Department CLINICAL SOCIAL WORK PLACEMENT NOTE 01/04/2013  Patient:  Natasha Davila  Account  Number:  [id number] Admit date:  01/02/2013  Clinical Social Worker:  Doroteo Glassman  Date/time:  01/04/2013 10:35 AM  Clinical Social Work is seeking post-discharge placement for this patient at the following level of care:   SKILLED NURSING   (*CSW will update this form in Epic as items are completed)   01/04/2013  Patient/family provided with [facility] Department of Clinical Social Work's list of facilities offering this level of care within the geographic area requested by the patient (or if unable, by the patient's family).  01/04/2013  Patient/family informed of their freedom to choose among providers that offer the needed level of care, that participate in Medicare, Medicaid or managed care program needed by the patient, have an available bed and are willing to accept the patient.  01/04/2013  Patient/family informed of [facility]' ownership interest in [facility], as well as of the fact that they are under no obligation to receive care at this facility.  PASARR submitted to EDS on 01/05/2013 PASARR number received from EDS on 01/05/2013  FL2 transmitted to all facilities in geographic area requested by pt/family on  01/04/2013 FL2 transmitted to all facilities within larger geographic area on   Patient informed that his/her managed care company has contracts with or will negotiate with  certain facilities, including the following:     Patient/family informed of bed offers received: 01/05/2013  Patient chooses bed at [facility] Physician recommends and patient chooses bed at    Patient to be transferred to  on  [facility] on 01/05/2013 Patient to be transferred to  facility by ambulance Sharin Mons)  The following physician request were entered in Epic:   Additional Comments:   CSW to continue to follow.  Providence Crosby, LCSWA Clinical Social Work [phone]   Jacklynn Lewis, MSW, LCSWA (coverage for Cori Razor,  [place]) Clinical Social Work [phone]

## 2013-01-04 NOTE — Progress Notes (Signed)
Patient ID: Natasha Davila, female   DOB: October 06, 1951, 62 y.o.   MRN: [id number] Postoperative day 2 total hip arthroplasty. Discontinue IV today. Hemoglobin 9.9. Anticipate discharge to rehabilitation.

## 2013-01-05 ENCOUNTER — Encounter (HOSPITAL_COMMUNITY): Payer: Self-pay | Admitting: Orthopaedic Surgery

## 2013-01-05 ENCOUNTER — Non-Acute Institutional Stay: Payer: Self-pay | Admitting: Family Medicine

## 2013-01-05 ENCOUNTER — Telehealth: Payer: Self-pay | Admitting: Family Medicine

## 2013-01-05 DIAGNOSIS — Z720 Tobacco use: Secondary | ICD-10-CM | POA: Insufficient documentation

## 2013-01-05 DIAGNOSIS — M169 Osteoarthritis of hip, unspecified: Secondary | ICD-10-CM

## 2013-01-05 DIAGNOSIS — F319 Bipolar disorder, unspecified: Secondary | ICD-10-CM

## 2013-01-05 DIAGNOSIS — N189 Chronic kidney disease, unspecified: Secondary | ICD-10-CM | POA: Insufficient documentation

## 2013-01-05 DIAGNOSIS — I10 Essential (primary) hypertension: Secondary | ICD-10-CM

## 2013-01-05 DIAGNOSIS — B1920 Unspecified viral hepatitis C without hepatic coma: Secondary | ICD-10-CM | POA: Insufficient documentation

## 2013-01-05 DIAGNOSIS — G479 Sleep disorder, unspecified: Secondary | ICD-10-CM | POA: Insufficient documentation

## 2013-01-05 LAB — CBC
HCT: 27.3 % — ABNORMAL LOW (ref 36.0–46.0)
Hemoglobin: 9.4 g/dL — ABNORMAL LOW (ref 12.0–15.0)
MCV: 92.2 fL (ref 78.0–100.0)
RBC: 2.96 MIL/uL — ABNORMAL LOW (ref 3.87–5.11)
RDW: 12.2 % (ref 11.5–15.5)
WBC: 6.6 10*3/uL (ref 4.0–10.5)

## 2013-01-05 NOTE — Progress Notes (Signed)
Pt d/c'd to [facility]. Report given to [facility]. Pt premedicated for transport.

## 2013-01-05 NOTE — Progress Notes (Signed)
Physical Therapy Treatment Patient Details Name: Natasha Davila MRN: [id number] DOB: Jun 06, 1951 Today's Date: 01/05/2013 Time: 4098-1191 PT Time Calculation (min): 24 min  PT Assessment / Plan / Recommendation Comments on Treatment Session  Pt plans to D/C to SNF today.    Follow Up Recommendations  SNF     Does the patient have the potential to tolerate intense rehabilitation     Barriers to Discharge        Equipment Recommendations  None recommended by PT    Recommendations for Other Services    Frequency     Plan Discharge plan remains appropriate    Precautions / Restrictions Precautions Precautions: Fall Restrictions Weight Bearing Restrictions: No LLE Weight Bearing: Weight bearing as tolerated   Pertinent Vitals/Pain C/o 9/10 pain Pre medicated ICE applied    Mobility  Bed Mobility Bed Mobility: Supine to Sit;Sit to Supine Supine to Sit: 4: Min assist Sit to Supine: 4: Min assist Details for Bed Mobility Assistance: for LLE and increased time Transfers Transfers: Sit to Stand;Stand to Sit Sit to Stand: 4: Min guard;From bed;With upper extremity assist Stand to Sit: 4: Min guard;To bed;With upper extremity assist Details for Transfer Assistance: VCS safety, technique, hand placement. and increased time Ambulation/Gait Ambulation/Gait Assistance: 4: Min assist;4: Min guard Ambulation Distance (Feet): 42 Feet Assistive device: Rolling walker Ambulation/Gait Assistance Details: increased c/o pain despite pre medication.  Increased time and several standing rest breaks. Gait Pattern: Step-to pattern;Trunk flexed Gait velocity: decreased General Gait Details: VCs for posture, positioning in RW     PT Goals                                                  progressing    Visit Information  Last PT Received On: 01/05/13 Assistance Needed: +1    Subjective Data      Cognition       Balance   fair  End of Session PT - End of Session Equipment  Utilized During Treatment: Gait belt Activity Tolerance: Patient limited by pain;Patient limited by fatigue Patient left: with call bell/phone within reach;in bed Nurse Communication: Mobility status   Felecia Shelling  PTA [facility]  Acute  Rehab Pager      [phone]

## 2013-01-05 NOTE — Progress Notes (Addendum)
Subjective: 3 Days Post-Op Procedure(s) (LRB): LEFT TOTAL HIP ARTHROPLASTY ANTERIOR APPROACH (Left) Patient reports pain as moderate.  No complaints.  Objective: Vital signs in last 24 hours: Temp:  [98.2 F (36.8 C)-99.1 F (37.3 C)] 99.1 F (37.3 C) (03/17 0500) Pulse Rate:  [91-97] 97 (03/17 0500) Resp:  [18-20] 18 (03/17 0500) BP: (136-146)/(75-85) 146/85 mmHg (03/17 0500) SpO2:  [93 %-100 %] 96 % (03/17 0500)  Intake/Output from previous day: 03/16 0701 - 03/17 0700 In: 480 [P.O.:480] Out: -  Intake/Output this shift:     Recent Labs  01/03/13 0518 01/04/13 0414 01/05/13 0405  HGB 10.3* 9.9* 9.4*    Recent Labs  01/04/13 0414 01/05/13 0405  WBC 8.4 6.6  RBC 3.13* 2.96*  HCT 28.9* 27.3*  PLT 286 279    Recent Labs  01/03/13 0518  NA 135  K 3.6  CL 103  CO2 22  BUN 12  CREATININE 1.30*  GLUCOSE 131*  CALCIUM 8.3*   No results found for this basename: LABPT, INR,  in the last 72 hours  Neurovascular intact Dorsiflexion/Plantar flexion intact Incision: dressing C/D/I Compartment soft  Assessment/Plan: 3 Days Post-Op Procedure(s) (LRB): LEFT TOTAL HIP ARTHROPLASTY ANTERIOR APPROACH (Left) Weight bearing as tolerated left lower extremity  Discharge to SNF  When bed available  Tay Whitwell 01/05/2013, 9:15 AM Asymptomatic Acute blood loss anemia on iron replacemnt

## 2013-01-05 NOTE — Assessment & Plan Note (Addendum)
BP Readings from Last 3 Encounters:  01/05/13 146/85  01/05/13 146/85  12/29/12 159/99   BP elevated while in the hospital.  Will continue her on lisinopril and monitor pressures.  If not improved from readings in the hospital we can consider increasing lisinopril.

## 2013-01-05 NOTE — Progress Notes (Signed)
Pt for discharge to [facility].  CSW faxed pt discharge information via [facility], discussed with pt at bedside, provided RN phone number to call report, and arranged ambulance transport ([facility]) to [place].  No further social work needs identified at this time.  CSW signed off.   Jacklynn Lewis, MSW, LCSWA (coverage for Cori Razor, [place]) Clinical Social Work [phone]

## 2013-01-05 NOTE — Care Management Note (Signed)
    Page 1 of 1   01/05/2013     10:53:58 AM   CARE MANAGEMENT NOTE 01/05/2013  Patient:  Natasha Davila, Natasha Davila   Account Number:  [id number]  Date Initiated:  01/05/2013  Documentation initiated by:  Lorenda Ishihara  Subjective/Objective Assessment:   62 yo female admitted s/p THA.     Action/Plan:   SNF for rehab   Anticipated DC Date:  01/05/2013   Anticipated DC Plan:  SKILLED NURSING FACILITY  In-house referral  Clinical Social Worker      DC Planning Services  CM consult      Choice offered to / List presented to:             Status of service:  Completed, signed off Medicare Important Message given?   (If response is "NO", the following Medicare IM given date fields will be blank) Date Medicare IM given:   Date Additional Medicare IM given:    Discharge Disposition:  SKILLED NURSING FACILITY  Per UR Regulation:  Reviewed for med. necessity/level of care/duration of stay  If discussed at Long Length of Stay Meetings, dates discussed:    Comments:

## 2013-01-05 NOTE — Assessment & Plan Note (Signed)
Lab Results  Component Value Date   CREATININE 1.30* 01/03/2013    Baseline cr around 1.2, appears stable at 1.3 currently.  She is on ACE-I, so will need to be followed closely if this dose is increased for better HTN control or NSAIDS are added for pain control.

## 2013-01-05 NOTE — Progress Notes (Signed)
Utilization review completed.  

## 2013-01-05 NOTE — Assessment & Plan Note (Addendum)
S/p total arthroplasty of the L hip on 01/02/13.  She is doing well and will be undergoing physical rehabilitation while at [facility].  She has norco 5/325 ordered q4 hours for pain control.  Will try to use narcotic pain medication for the shortest duration possible.  Robaxin ordered with discharge medications but will be discontinued as she will not gain much benefit from this medication.

## 2013-01-05 NOTE — Assessment & Plan Note (Signed)
Currently using ambien and seroquel for sleep.  Mirtzapine likely has additive effect as well.  Will not add on any additional medications for sleep due to potential for over sedation especially while using narcotic pain medication.

## 2013-01-05 NOTE — Discharge Summary (Signed)
Physician Discharge Summary  Patient ID: Natasha Davila MRN: [id number] DOB/AGE: January 25, 1951 62 y.o.  Admit date: 01/02/2013 Discharge date: 01/05/2013  Admission Diagnoses: Left hip OA  Discharge Diagnoses: S/p left total hip arthroplasty Principal Problem:   Degenerative arthritis of hip   Discharged Condition: stable  Hospital Course: Uncomplicated patient remained stable throughout hospital stay.  Consults: Physical therapy  Significant Diagnostic Studies: labs: asymptomatic Acute blood loss anemia secondary to surgery Hgb 9.4  Treatments: Iron replacement  Discharge Exam: Blood pressure 146/85, pulse 97, temperature 99.1 F (37.3 C), temperature source Oral, resp. rate 18, height 5\' 7"  (1.702 m), weight 72.122 kg (159 lb), SpO2 96.00%.  General: awake and alert no acute distress Vascular: dorsal pedal pulses intact bilateral Left lower extremity: dressing left hip clean dry and intact, EHL/FHL intact, calf supple non tender, sensation intact left foot to light touch   Disposition: Skilled nursing facility  Discharge Orders   Future Orders Complete By Expires     Discharge wound care:  As directed     Scheduling Instructions:      Keep dressing clean dry and intact until Wednesday then may remove dressing and shower    Weight bearing as tolerated  As directed         Medication List    TAKE these medications       aspirin 325 MG EC tablet  Take 1 tablet (325 mg total) by mouth 2 (two) times daily.     ferrous sulfate 325 (65 FE) MG tablet  Take 1 tablet (325 mg total) by mouth 3 (three) times daily after meals.     HYDROcodone-acetaminophen 5-325 MG per tablet  Commonly known as:  NORCO/VICODIN  Take 1-2 tablets by mouth every 4 (four) hours as needed.     lisinopril 10 MG tablet  Commonly known as:  PRINIVIL,ZESTRIL  Take 10 mg by mouth at bedtime.     methocarbamol 500 MG tablet  Commonly known as:  ROBAXIN  Take 1 tablet (500 mg total) by mouth  every 6 (six) hours as needed.     mirtazapine 45 MG tablet  Commonly known as:  REMERON  Take 45 mg by mouth at bedtime.     OVER THE COUNTER MEDICATION  Women's essential vitamin- one daily     OVER THE COUNTER MEDICATION  B Complex one daily     QUEtiapine 25 MG tablet  Commonly known as:  SEROQUEL  Take 50 mg by mouth at bedtime as needed. For sleep     zolpidem 5 MG tablet  Commonly known as:  AMBIEN  Take 5 mg by mouth at bedtime as needed. For sleep           Follow-up Information   Follow up with Kathryne Hitch, MD.   Contact information:   [address] [place] [address] [phone]       Follow up with Kathryne Hitch, MD In 2 weeks.   Contact information:   [address] [place] [address] [place] [address] [phone]       Signed: Richardean Canal 01/05/2013, 9:54 AM

## 2013-01-05 NOTE — Assessment & Plan Note (Signed)
Lab Results  Component Value Date   ALT 36* 12/29/2012   AST 47* 12/29/2012   ALKPHOS 101 12/29/2012   BILITOT 0.5 12/29/2012   Last LFT's mildly elevated on recent lab work, but appear stable.

## 2013-01-05 NOTE — Assessment & Plan Note (Addendum)
Patient reports she was diagnosed with bipolar disorder while living in [place].  Has been on multiple antipsychotics and mood stabilizers.  Reports her current regimen of mirtazapine and seroquel works very well for her.  Unfortunately she reports she has put on almost 20-30 lbs in the past few months.  Discussed increasing activity once rehab completed and can move better.

## 2013-01-05 NOTE — Progress Notes (Addendum)
CSW met with pt at bedside and provided bed offers.   Pt chooses bed at [facility] facility. CSW notified facility who confirmed bed availability today.  CSW faxed 30-day note and pt clinicals into [facility] for pasarr.  Awaiting pasarr number, but hopeful that will get pasarr number today.  CSW notified RN.  CSW to facilitate pt discharge to [facility] when pasarr received.   Addendum 11:09am: CSW received pasarr number. CSW to facilitate pt discharge needs to [facility] this afternoon.  Jacklynn Lewis, MSW, LCSWA (coverage for Cori Razor, [place]) Clinical Social Work [phone]

## 2013-01-05 NOTE — Telephone Encounter (Signed)
Call from [facility].  Asking to substitute norco 5/500 for 5/325 as this is what they have as backup supply.  Have OK'd this.

## 2013-01-05 NOTE — Progress Notes (Signed)
Subjective:    Patient ID: Natasha Davila, female    DOB: 1951-10-11, 62 y.o.   MRN: [id number]  HPI PCP: Dorrene German, MD  HPI:  62 yo female with past medical history of HTN, bipolar d/o, sleep disorder, sickle cell trait and hepatitis C who recently underwent elective L total hip arthroplasty on 01/03/13.  She is being admitted to SNF for short term rehabilitation.  Patient reports she has done well since surgery and has been up and walking with PT during hospitalization and some around her room on her own.   She reports her functional status prior to surgery was pretty good although she used a walker to get around 2/2 to her hip pain. Prior to her hip problems she was able to ambulate without any difficulty.  She does live alone but is able to complete all of her ADL's and IADL's.  Her pain has been well controlled, although she does feel a little bit of "buring pain" in her hip currently.    Past Medical History  Diagnosis Date  . Osteoarthritis     s/p L hip arthroplasty 12/2012  . Hypertension   . Bipolar affective disorder   . Chronic kidney disease     papillary necrosis   . Sickle cell trait   . Hepatitis     hepatitis C     Past Surgical History  Procedure Laterality Date  . Cholecystectomy  08/07/11  . Abdominal hysterectomy    . Tubal ligation    . Total hip arthroplasty Left 01/02/2013    Procedure: LEFT TOTAL HIP ARTHROPLASTY ANTERIOR APPROACH;  Surgeon: Kathryne Hitch, MD;  Location: [facility];  Service: Orthopedics;  Laterality: Left;  . Cesarean section     History   Social History  . Marital Status: Widowed    Spouse Name: N/A    Number of Children: N/A  . Years of Education: N/A   Occupational History  . Not on file.   Social History Main Topics  . Smoking status: Current Every Day Smoker -- 0.25 packs/day for 20 years    Types: Cigarettes  . Smokeless tobacco: Never Used  . Alcohol Use: Yes     Comment: occasional   . Drug Use: No  .  Sexually Active: Not on file   Other Topics Concern  . Not on file   Social History Narrative   Originally from [place], [place].  Moved to [place] around 4 years ago.  Lives alone but has friends and family that help out.      Review of Systems Denies fever, chills, constipation, diarrhea, nausea, abdominal pain or bloating, calf swelling or tenderness, shortness of breath, chest pain, headache.    Objective:   Physical Exam  Constitutional: She is oriented to person, place, and time.  Very pleasant female, in bed.  Appears well nourished.    HENT:  Head: Normocephalic and atraumatic.  Mouth/Throat: Oropharynx is clear and moist.  Neck: Neck supple. No thyromegaly present.  Cardiovascular: Normal rate, regular rhythm and normal heart sounds.   Peripheral pulses 2+ throughout.    Pulmonary/Chest: Effort normal and breath sounds normal. No respiratory distress. She has no wheezes.  Abdominal: Soft. She exhibits no distension. There is no tenderness. There is no rebound and no guarding.  Small surgical scar noted on upper abdomen and umbilical area.  Also with larger scar from prior csection along waist line.    Musculoskeletal: She exhibits edema (Mild edema of L upper leg.  No calf edema or tenderness).  Tenderness over surgical site.  Area is dressed and dressing is c/d/i.     Neurological: She is alert and oriented to person, place, and time.  Psychiatric:  Mood and affect seem normal with normal thought content at this time.           Assessment & Plan:

## 2013-01-06 ENCOUNTER — Other Ambulatory Visit: Payer: Self-pay | Admitting: Family Medicine

## 2013-01-06 MED ORDER — ZOLPIDEM TARTRATE 5 MG PO TABS: 5.0000 mg | ORAL_TABLET | Freq: Every evening | ORAL | Status: DC | PRN

## 2013-01-06 NOTE — Progress Notes (Signed)
Emergency Line Call:  Received call from [facility] that Ms. Cuccia is unhappy and ready to leave. Staff states that they need a script for Ambien ASAP for administration tonight. On review, Dr. Sheffield Slider printed Rx today for Ambien 5mg  #20/0R. Unsure where this script is, but I am going to fax a medication report to the facility to see if that will work. (Although it is unlikely that it will)  Outpatient scripts do not print in the hospital, but I will still attempt to do that. I do not have a prescription pad, but I will ask unit secretary if she has a blank one as well. At this time, I feel like my hands are tied and this is not an emergency medication. I have offered to do verbal order for Trazadone or benzo for her tonight but staff states patient will only take Ambien.   If unable to get it filled tonight, will defer to geriatric resident tomorrow.  Malak Duchesneau M. Alajah Witman, M.D. 01/06/2013 6:53 PM   Addendum: Attempted to print Rx but it did not print in hospital, as I expected. However, I was able to get a blank [facility] Prescription pad. New Rx written for Ambien 5mg  #20/0R and faxed to [facility] at [phone] with Dr. Martin Majestic original medication report as well, in case it was sent earlier today also so they do not double the refill. Staff at [facility] updated that this was sent. I'm still not confident patient will get her medication tonight.  Shakeema Lippman M. Holland Nickson, M.D. 01/06/2013 7:15 PM

## 2013-01-07 ENCOUNTER — Other Ambulatory Visit: Payer: Self-pay | Admitting: Family Medicine

## 2013-01-07 ENCOUNTER — Encounter: Payer: Self-pay | Admitting: Family Medicine

## 2013-01-07 ENCOUNTER — Non-Acute Institutional Stay: Payer: Medicare Other | Admitting: Family Medicine

## 2013-01-07 DIAGNOSIS — Z96649 Presence of unspecified artificial hip joint: Secondary | ICD-10-CM

## 2013-01-07 DIAGNOSIS — Z7982 Long term (current) use of aspirin: Secondary | ICD-10-CM

## 2013-01-07 DIAGNOSIS — I10 Essential (primary) hypertension: Secondary | ICD-10-CM

## 2013-01-07 MED ORDER — HYDROCODONE-ACETAMINOPHEN 10-325 MG PO TABS: 1.0000 | ORAL_TABLET | Freq: Four times a day (QID) | ORAL | Status: DC | PRN

## 2013-01-07 MED ORDER — HYDROCODONE-ACETAMINOPHEN 5-325 MG PO TABS: 2.0000 | ORAL_TABLET | ORAL | Status: DC | PRN

## 2013-01-07 NOTE — Patient Instructions (Addendum)
Your hydrocodone pain medication prescription was faxed to [facility] at [facility].   Keep your appointment with [facility] Mar 21st and with Dr Darrelyn Hillock next week.   We'd like to see you in Geriatric Clinic in the [facility] the morning of March 27th. Call [phone] for your appointment time if you haven't been called by the afternoon of the 21st.

## 2013-01-07 NOTE — Progress Notes (Signed)
  Subjective:    Patient ID: Natasha Davila, female    DOB: [birth year], 62 y.o.   MRN: [id number]  HPI She desires discharge today after a 2 day stay due to delay in getting medications and problems with getting wash cloths, etc. Says she is walking well with the walker in PT. Pain currently 7/10 2 hours after getting 5 mg of hydrocodone. Has a walker at home and family will be helping her. Has 3 steps without a handrail to get into her home. Denies problems getting on or off the toilet. Eating well, no constipation.    Review of Systems     Objective:   Physical Exam  Constitutional: She is oriented to person, place, and time. She appears well-developed and well-nourished.  HENT:  Right Ear: External ear normal.  Left Ear: External ear normal.  edentulous  Eyes: Conjunctivae are normal. Pupils are equal, round, and reactive to light.  Cardiovascular: Normal rate and regular rhythm.   No murmur heard. Pulmonary/Chest: Effort normal and breath sounds normal.  Abdominal: Soft. She exhibits no distension. There is no tenderness. There is no guarding.  Musculoskeletal:  Hydrocolloid dressing over lateral left hip wound with a few dark spots showing through.  Minimal tenderness over the wound with no redness. Tender over the anterior thigh without swelling. No calf tenderness on ankle edema.   Neurological: She is alert and oriented to person, place, and time.  Psychiatric: Judgment and thought content normal.  Mildly anxious. Rocking while sitting up in bed which she attributes to pain.           Assessment & Plan:

## 2013-01-07 NOTE — Telephone Encounter (Signed)
Written and attached to fax to send to [facility]  Discharge prescription of hydrocodone/APAP 10/325 mg tablets faxed to [facility] [address]

## 2013-01-07 NOTE — Assessment & Plan Note (Signed)
well controlled  

## 2013-01-07 NOTE — Assessment & Plan Note (Signed)
Progressing well in ambulation and wants early discharge

## 2013-01-09 NOTE — Discharge Summary (Signed)
Patient ID: Natasha Davila MRN: [id number] DOB/AGE: 01-27-1951 62 y.o.  Admit date: 01/02/2013 Discharge date: 01/09/2013  Admission Diagnoses:  Principal Problem:   Status post THR (total hip replacement)   Discharge Diagnoses:  Same  Past Medical History  Diagnosis Date  . Osteoarthritis     s/p L hip arthroplasty 12/2012  . Hypertension   . Bipolar affective disorder   . Chronic kidney disease     papillary necrosis   . Sickle cell trait   . Hepatitis     hepatitis C     Surgeries: Procedure(s): LEFT TOTAL HIP ARTHROPLASTY ANTERIOR APPROACH on 01/02/2013   Consultants:    Discharged Condition: Improved  Hospital Course: Natasha Davila is an 62 y.o. female who was admitted 01/02/2013 for operative treatment ofStatus post THR (total hip replacement). Patient has severe unremitting pain that affects sleep, daily activities, and work/hobbies. After pre-op clearance the patient was taken to the operating room on 01/02/2013 and underwent  Procedure(s): LEFT TOTAL HIP ARTHROPLASTY ANTERIOR APPROACH.    Patient was given perioperative antibiotics:  Anti-infectives   Start     Dose/Rate Route Frequency Ordered Stop   01/03/13 0600  ceFAZolin (ANCEF) IVPB 2 g/50 mL premix     2 g 100 mL/hr over 30 Minutes Intravenous On call to O.R. 01/02/13 1247 01/02/13 1432   01/02/13 2000  ceFAZolin (ANCEF) IVPB 1 g/50 mL premix     1 g 100 mL/hr over 30 Minutes Intravenous Every 6 hours 01/02/13 1809 01/03/13 0253       Patient was given sequential compression devices, early ambulation, and chemoprophylaxis to prevent DVT.  Patient benefited maximally from hospital stay and there were no complications.    Recent vital signs: No data found.    Recent laboratory studies: No results found for this basename: WBC, HGB, HCT, PLT, NA, K, CL, CO2, BUN, CREATININE, GLUCOSE, PT, INR, CALCIUM, 2,  in the last 72 hours   Discharge Medications:     Medication List    STOP taking  these medications       HYDROcodone-acetaminophen 5-325 MG per tablet  Commonly known as:  NORCO/VICODIN      TAKE these medications       aspirin 325 MG EC tablet  Take 1 tablet (325 mg total) by mouth 2 (two) times daily.     ferrous sulfate 325 (65 FE) MG tablet  Take 1 tablet (325 mg total) by mouth 3 (three) times daily after meals.     lisinopril 10 MG tablet  Commonly known as:  PRINIVIL,ZESTRIL  Take 10 mg by mouth at bedtime.     mirtazapine 45 MG tablet  Commonly known as:  REMERON  Take 45 mg by mouth at bedtime.     OVER THE COUNTER MEDICATION  B Complex one daily     QUEtiapine 25 MG tablet  Commonly known as:  SEROQUEL  Take 50 mg by mouth at bedtime as needed. For sleep        Diagnostic Studies: Dg Hip Complete Left  01/02/2013  *RADIOLOGY REPORT*  Clinical Data: Postop left total hip replacement.  DG C-ARM 1-60 MIN - [facility]PT [facility],  LEFT HIP - COMPLETE 2+ VIEW  Comparison: None.  Findings: Spot fluoroscopic images demonstrate left total arthroplasty without demonstrated complication.  Bone detail is limited by technique.  There is soft tissue emphysema lateral to the prosthesis and within the joint.  IMPRESSION: No demonstrated complication following left total hip arthroplasty.   Original  Report Authenticated By: Carey Bullocks, M.D.    Dg Pelvis Portable  01/02/2013  *RADIOLOGY REPORT*  Clinical Data: Left total hip replacement.  PORTABLE PELVIS  Comparison: None.  Findings: AP portable view of the left hip demonstrates that the acetabular and femoral components of the total hip prosthesis appear in excellent position in the AP projection. Osteophytes are seen on the acetabulum.  IMPRESSION: Satisfactory appearance of the left hip in the AP projection after total hip prosthesis insertion.   Original Report Authenticated By: Francene Boyers, M.D.    Dg Hip Portable 1 View Left  01/02/2013  *RADIOLOGY REPORT*  Clinical Data: Postop hip.  PORTABLE LEFT HIP - 1 VIEW   Comparison: Intraoperative exam 01/02/2013.  Findings: Left total hip replacement appears in satisfactory position on this single projection.  No complication noted.  IMPRESSION: Left total hip replacement appears in satisfactory position on this single projection.  No complication noted.   Original Report Authenticated By: Lacy Duverney, M.D.    Dg C-arm 1-60 Min-no Report  01/02/2013  *RADIOLOGY REPORT*  Clinical Data: Postop left total hip replacement.  DG C-ARM 1-60 MIN - [facility],  LEFT HIP - COMPLETE 2+ VIEW  Comparison: None.  Findings: Spot fluoroscopic images demonstrate left total arthroplasty without demonstrated complication.  Bone detail is limited by technique.  There is soft tissue emphysema lateral to the prosthesis and within the joint.  IMPRESSION: No demonstrated complication following left total hip arthroplasty.   Original Report Authenticated By: Carey Bullocks, M.D.     Disposition: 03-Skilled Nursing Facility      Discharge Orders   Future Appointments Provider Department Dept Phone   01/15/2013 8:30 AM [facility] [phone]   Future Orders Complete By Expires     Discharge wound care:  As directed     Scheduling Instructions:      Keep dressing clean dry and intact until Wednesday then may remove dressing and shower    Weight bearing as tolerated  As directed        Follow-up Information   Follow up with Kathryne Hitch, MD.   Contact information:   [address] [place] [address] [phone]       Follow up with Kathryne Hitch, MD In 2 weeks.   Contact information:   [address] [place] [address] [phone]        Signed: Kathryne Hitch 01/09/2013, 7:16 AM

## 2013-01-15 ENCOUNTER — Encounter: Payer: Self-pay | Admitting: Family Medicine

## 2013-01-15 ENCOUNTER — Ambulatory Visit (INDEPENDENT_AMBULATORY_CARE_PROVIDER_SITE_OTHER): Payer: Medicare Other | Admitting: Family Medicine

## 2013-01-15 VITALS — BP 156/93 | HR 99 | Ht 66.5 in | Wt 162.0 lb

## 2013-01-15 DIAGNOSIS — Z0000 Encounter for general adult medical examination without abnormal findings: Secondary | ICD-10-CM

## 2013-01-15 DIAGNOSIS — I10 Essential (primary) hypertension: Secondary | ICD-10-CM

## 2013-01-15 DIAGNOSIS — Z9181 History of falling: Secondary | ICD-10-CM

## 2013-01-15 DIAGNOSIS — Z96649 Presence of unspecified artificial hip joint: Secondary | ICD-10-CM

## 2013-01-15 MED ORDER — LISINOPRIL-HYDROCHLOROTHIAZIDE 10-12.5 MG PO TABS: 1.0000 | ORAL_TABLET | Freq: Every day | ORAL | Status: DC

## 2013-01-15 NOTE — Assessment & Plan Note (Addendum)
Doing well, pain adequately controlled.  She has follow up with Dr. Magnus Ivan today. Has home PT and is using rolling walker for ambulation currently.

## 2013-01-15 NOTE — Assessment & Plan Note (Signed)
Given handouts on how to obtain colonoscopy and mammogram once she has recovered from hip replacement.

## 2013-01-15 NOTE — Assessment & Plan Note (Signed)
Has history of 4 falls in the past year, none resulting injury.  These seem to be related to the pain she was experiencing prior to her hip replacement.  No evidence of orthostasis or vertigo contributing and does not seem to related to home safety issues.  She has not had any falls since her surgery.

## 2013-01-15 NOTE — Assessment & Plan Note (Addendum)
BP elevated today with some elevated readings while in the nursing home.  Will add hctz 12.5mg  to lisinopril, follow up in two weeks to recheck blood pressure and bmet.

## 2013-01-15 NOTE — Patient Instructions (Addendum)
Thank you for coming in today, it was good to see you I am glad to see your rehabilitation is going well I am changing your blood pressure medication to lisinopril-hydrocholorothiazide, make a follow up appointment in two weeks to recheck your blood pressure and we will check some labwork at that time as well. Read through the brochure about preventing falls.  Once you have recovered from your hip surgery, schedule your mammogram and colonoscopy.

## 2013-01-15 NOTE — Progress Notes (Signed)
  Subjective:    Patient ID: Natasha Davila, female    DOB: 13-Sep-1951, 62 y.o.   MRN: [id number]  HPI  1. S/p Hip replacement:  Here for nursing home follow up.  Was admitted briefly last week for rehabilitation after L hip arthroplasty.  Has been doing well since surgery.  Pain is adequately controlled and she has follow up with Dr. Magnus Ivan this afternoon.  She uses her hydrocodone only when her pain is severe and she has had no constipation with this.  She has a home physical therapist that has been doing strengthening and ROM exercises with her.  She reports that she has had 4 falls in the past year prior 2/2 to pain in her hip but none since her surgery.  She currently ambulates with a front wheel rolling walker and has had no difficulty navigating the three steps that lead to her house.  She denies any dizziness, pre-syncope, or tripping over objects contributing to her falls.   2. HTN:  BP elevated.  She reports that she is compliant with her lisinopril, which she takes each night.  She denies headache, dizziness, chest pain, vision changes.     3. Preventitive:  Reports that she has had a colonoscopy in [place] but this was >10 years ago.  Reports that results were normal. Does not remember when her last mammogram was Reports having a partial hysterectomy and has not had a pap in >31 years.   Review of Systems Per HPI    Objective:   Physical Exam  Constitutional: She appears well-nourished. No distress.  HENT:  Head: Normocephalic and atraumatic.  Cardiovascular: Normal rate, regular rhythm and normal heart sounds.   Pulmonary/Chest: Effort normal and breath sounds normal.  Musculoskeletal: She exhibits no edema.  No calf tenderness or swelling.   She walks with a slightly antalgic gait with a rolling front wheel walker.  Gait is not wide base or unstable.            Assessment & Plan:

## 2013-01-22 ENCOUNTER — Emergency Department (HOSPITAL_COMMUNITY)
Admission: EM | Admit: 2013-01-22 | Discharge: 2013-01-22 | Disposition: A | Discharge status: Home / Self Care | Payer: Medicare Other | Attending: Emergency Medicine | Admitting: Emergency Medicine

## 2013-01-22 ENCOUNTER — Encounter (HOSPITAL_COMMUNITY): Payer: Self-pay

## 2013-01-22 DIAGNOSIS — I129 Hypertensive chronic kidney disease with stage 1 through stage 4 chronic kidney disease, or unspecified chronic kidney disease: Secondary | ICD-10-CM | POA: Insufficient documentation

## 2013-01-22 DIAGNOSIS — R1084 Generalized abdominal pain: Secondary | ICD-10-CM | POA: Insufficient documentation

## 2013-01-22 DIAGNOSIS — Z8619 Personal history of other infectious and parasitic diseases: Secondary | ICD-10-CM | POA: Insufficient documentation

## 2013-01-22 DIAGNOSIS — Z79899 Other long term (current) drug therapy: Secondary | ICD-10-CM | POA: Insufficient documentation

## 2013-01-22 DIAGNOSIS — R259 Unspecified abnormal involuntary movements: Secondary | ICD-10-CM | POA: Insufficient documentation

## 2013-01-22 DIAGNOSIS — N189 Chronic kidney disease, unspecified: Secondary | ICD-10-CM | POA: Insufficient documentation

## 2013-01-22 DIAGNOSIS — F419 Anxiety disorder, unspecified: Secondary | ICD-10-CM

## 2013-01-22 DIAGNOSIS — F17200 Nicotine dependence, unspecified, uncomplicated: Secondary | ICD-10-CM | POA: Insufficient documentation

## 2013-01-22 DIAGNOSIS — Z9851 Tubal ligation status: Secondary | ICD-10-CM | POA: Insufficient documentation

## 2013-01-22 DIAGNOSIS — R61 Generalized hyperhidrosis: Secondary | ICD-10-CM | POA: Insufficient documentation

## 2013-01-22 DIAGNOSIS — Z90710 Acquired absence of both cervix and uterus: Secondary | ICD-10-CM | POA: Insufficient documentation

## 2013-01-22 DIAGNOSIS — Z862 Personal history of diseases of the blood and blood-forming organs and certain disorders involving the immune mechanism: Secondary | ICD-10-CM | POA: Insufficient documentation

## 2013-01-22 DIAGNOSIS — F411 Generalized anxiety disorder: Secondary | ICD-10-CM | POA: Insufficient documentation

## 2013-01-22 DIAGNOSIS — R5381 Other malaise: Secondary | ICD-10-CM | POA: Insufficient documentation

## 2013-01-22 DIAGNOSIS — M1990 Unspecified osteoarthritis, unspecified site: Secondary | ICD-10-CM | POA: Insufficient documentation

## 2013-01-22 DIAGNOSIS — Z9089 Acquired absence of other organs: Secondary | ICD-10-CM | POA: Insufficient documentation

## 2013-01-22 DIAGNOSIS — F319 Bipolar disorder, unspecified: Secondary | ICD-10-CM | POA: Insufficient documentation

## 2013-01-22 LAB — COMPREHENSIVE METABOLIC PANEL
Alkaline Phosphatase: 90 U/L (ref 39–117)
BUN: 13 mg/dL (ref 6–23)
Chloride: 105 mEq/L (ref 96–112)
Creatinine, Ser: 1.22 mg/dL — ABNORMAL HIGH (ref 0.50–1.10)
GFR calc Af Amer: 54 mL/min — ABNORMAL LOW (ref >90)
Glucose, Bld: 129 mg/dL — ABNORMAL HIGH (ref 70–99)
Potassium: 3.2 mEq/L — ABNORMAL LOW (ref 3.5–5.1)
Total Bilirubin: 0.4 mg/dL (ref 0.3–1.2)

## 2013-01-22 LAB — CBC WITH DIFFERENTIAL/PLATELET
Eosinophils Absolute: 0.5 10*3/uL (ref 0.0–0.7)
HCT: 33.7 % — ABNORMAL LOW (ref 36.0–46.0)
Hemoglobin: 12 g/dL (ref 12.0–15.0)
Lymphs Abs: 3.1 10*3/uL (ref 0.7–4.0)
MCH: 32.4 pg (ref 26.0–34.0)
Monocytes Absolute: 0.4 10*3/uL (ref 0.1–1.0)
Monocytes Relative: 6 % (ref 3–12)
Neutrophils Relative %: 32 % — ABNORMAL LOW (ref 43–77)
RBC: 3.7 MIL/uL — ABNORMAL LOW (ref 3.87–5.11)

## 2013-01-22 LAB — URINALYSIS, ROUTINE W REFLEX MICROSCOPIC
Ketones, ur: NEGATIVE mg/dL
Nitrite: NEGATIVE
Protein, ur: NEGATIVE mg/dL
Urobilinogen, UA: 1 mg/dL (ref 0.0–1.0)

## 2013-01-22 LAB — LIPASE, BLOOD: Lipase: 48 U/L (ref 11–59)

## 2013-01-22 LAB — URINE MICROSCOPIC-ADD ON

## 2013-01-22 MED ORDER — GI COCKTAIL ~~LOC~~
30.0000 mL | Freq: Once | ORAL | Status: AC
  Administered 2013-01-22: 30 mL via ORAL
  Filled 2013-01-22: qty 30

## 2013-01-22 MED ORDER — HYDROCODONE-ACETAMINOPHEN 5-325 MG PO TABS: 1.0000 | ORAL_TABLET | ORAL | Status: DC | PRN

## 2013-01-22 MED ORDER — HYDROCODONE-ACETAMINOPHEN 5-325 MG PO TABS
1.0000 | ORAL_TABLET | Freq: Once | ORAL | Status: AC
  Administered 2013-01-22: 1 via ORAL
  Filled 2013-01-22: qty 1

## 2013-01-22 NOTE — ED Provider Notes (Signed)
History     CSN: [id number]  Arrival date & time 01/22/13  0304   First MD Initiated Contact with Patient 01/22/13 [phone]      Chief Complaint  Patient presents with  . Fever  . Abdominal Pain    (Consider location/radiation/quality/duration/timing/severity/associated sxs/prior treatment) HPI History provided by pt.   Pt was eating a container of ice cream at 3pm yesterday.  The bottom of it appeared mucousy and she suddenly became diaphoretic, weak and tremulous and her entire stomach felt raw.  Symptoms have persisted but she regained enough strength to come to ED.  Denies lightheadedness, chest pain, SOB, palpitations, N/V/D and urinary sx.  No h/o anxiety attack.  No known h/o CAD.   Past Medical History  Diagnosis Date  . Osteoarthritis     s/p L hip arthroplasty 12/2012  . Hypertension   . Bipolar affective disorder   . Chronic kidney disease     papillary necrosis   . Sickle cell trait   . Hepatitis     hepatitis C     Past Surgical History  Procedure Laterality Date  . Cholecystectomy  08/07/11  . Abdominal hysterectomy    . Tubal ligation    . Total hip arthroplasty Left 01/02/2013    Procedure: LEFT TOTAL HIP ARTHROPLASTY ANTERIOR APPROACH;  Surgeon: Kathryne Hitch, MD;  Location: [facility];  Service: Orthopedics;  Laterality: Left;  . Cesarean section      History reviewed. No pertinent family history.  History  Substance Use Topics  . Smoking status: Current Every Day Smoker -- 0.25 packs/day for 20 years    Types: Cigarettes  . Smokeless tobacco: Never Used  . Alcohol Use: Yes     Comment: occasional     OB History   Grav Para Term Preterm Abortions TAB SAB Ect Mult Living                  Review of Systems  All other systems reviewed and are negative.    Allergies  Oxycodone  Home Medications   Current Outpatient Rx  Name  Route  Sig  Dispense  Refill  . ferrous sulfate 325 (65 FE) MG tablet   Oral   Take 325 mg by mouth 3 (three)  times daily after meals.         Marland Kitchen HYDROcodone-acetaminophen (NORCO) 10-325 MG per tablet   Oral   Take 1 tablet by mouth every 6 (six) hours as needed for pain.         . mirtazapine (REMERON) 45 MG tablet   Oral   Take 45 mg by mouth at bedtime.          Marland Kitchen QUEtiapine (SEROQUEL) 25 MG tablet   Oral   Take 50 mg by mouth at bedtime. For sleep         . zolpidem (AMBIEN) 5 MG tablet   Oral   Take 5 mg by mouth at bedtime. For sleep           BP 116/85  Pulse 105  Temp(Src) 99 F (37.2 C) (Oral)  Resp 18  SpO2 96%  Physical Exam  Nursing note and vitals reviewed. Constitutional: She is oriented to person, place, and time. She appears well-developed and well-nourished. No distress.  HENT:  Head: Normocephalic and atraumatic.  Eyes:  Normal appearance  Neck: Normal range of motion.  Cardiovascular: Normal rate and regular rhythm.   Pulmonary/Chest: Effort normal and breath sounds normal. No respiratory distress.  Abdominal: Soft. Bowel sounds are normal. She exhibits no distension. There is no tenderness.  Mild, diffuse ttp  Genitourinary:  No CVA tenderness  Musculoskeletal: Normal range of motion.  Neurological: She is alert and oriented to person, place, and time.  Skin: Skin is warm and dry. No rash noted.  Psychiatric: She has a normal mood and affect. Her behavior is normal.    ED Course  Procedures (including critical care time)   Date: 01/22/2013  Rate: 94  Rhythm: normal sinus rhythm  QRS Axis: normal  Intervals: normal  ST/T Wave abnormalities: normal  Conduction Disutrbances:none  Narrative Interpretation:   Old EKG Reviewed: none available   Labs Reviewed  CBC WITH DIFFERENTIAL - Abnormal; Notable for the following:    RBC 3.70 (*)    HCT 33.7 (*)    Neutrophils Relative 32 (*)    Lymphocytes Relative 53 (*)    Eosinophils Relative 8 (*)    All other components within normal limits  COMPREHENSIVE METABOLIC PANEL - Abnormal;  Notable for the following:    Potassium 3.2 (*)    Glucose, Bld 129 (*)    Creatinine, Ser 1.22 (*)    Albumin 3.1 (*)    AST 40 (*)    GFR calc non Af Amer 47 (*)    GFR calc Af Amer 54 (*)    All other components within normal limits  URINALYSIS, ROUTINE W REFLEX MICROSCOPIC - Abnormal; Notable for the following:    Color, Urine AMBER (*)    APPearance CLOUDY (*)    Bilirubin Urine SMALL (*)    Leukocytes, UA LARGE (*)    All other components within normal limits  URINE MICROSCOPIC-ADD ON - Abnormal; Notable for the following:    Squamous Epithelial / LPF MANY (*)    Casts HYALINE CASTS (*)    All other components within normal limits  URINE CULTURE  LIPASE, BLOOD  TROPONIN I   No results found.   1. Anxiety       MDM  62yo F became diaphoretic, weak and tremulous and developed diffuse abd "rawness" when she discovered that she was eating bad ice cream at 3pm yesterday.  Denies CP/SOB/palpitations.  On exam, afebrile, NAD, mild, diffuse abd tenderness.  Suspect that patient had an anxiety attack or vasovagal reaction but because of her age, EKG and troponin ordered to r/o ACS.  Pt to receive 1 po vicodin as well as GI cocktail for pain.  4:04 AM   Labs unremarkable and EKG non-ischemic.  Results discussed w/ pt.  She reports that her pain has improved and tenderness decreased on repeat exam.  VSS.  Pt prescribed 10 vicodin for abdominal pain and I recommended rest and fluids as well as PCP f/u.  Return precautions including syncope, CP, SOB and worsening abdominal pain discussed.        Otilio Miu, PA-C 01/22/13 [phone]

## 2013-01-22 NOTE — ED Provider Notes (Signed)
Medical screening examination/treatment/procedure(s) were conducted as a shared visit with non-physician practitioner(s) and myself.  I personally evaluated the patient during the encounter  Pt well appearing, no distress I doubt ACS at this time Abdomen soft on my exam Stable for d/c  Joya Gaskins, MD 01/22/13 [phone]

## 2013-01-22 NOTE — ED Notes (Signed)
Pt discharge.Vital signs stable and GCS 15. 

## 2013-01-22 NOTE — ED Notes (Signed)
Pt states she was at home eating ice cream.  When she got to the bottom of the ice cream she noticed that it was "very mucousy".  Pt reports suddenly becoming nauseous with fever, chills and abdominal pain.  Pt thinks she ate bad ice cream.  Pt denying any nausea, vomiting or diarrhea currently.  Pt only reporting generalized abdominal pain "it feels very raw."  Pt does not have a thermometer at home.  States all she felt after finishing ice cream was a rush of heat.  Nad.

## 2013-01-22 NOTE — ED Notes (Signed)
Pt discharged.Vital signs stable and GCS 15 

## 2013-01-22 NOTE — ED Provider Notes (Signed)
Pt well appearing, no distress, abdomen soft on my exam She thinks she has food poisonin Labs currently pending Pt stable at this time    Date: 01/22/2013  Rate: 94  Rhythm: normal sinus rhythm  QRS Axis: normal  Intervals: normal  ST/T Wave abnormalities: nonspecific ST changes  Conduction Disutrbances:none  Narrative Interpretation:   Old EKG Reviewed: unchanged    Joya Gaskins, MD 01/22/13 [phone]

## 2013-01-24 LAB — URINE CULTURE

## 2013-01-25 ENCOUNTER — Telehealth (HOSPITAL_COMMUNITY): Payer: Self-pay | Admitting: Emergency Medicine

## 2013-01-25 NOTE — ED Notes (Signed)
Patient has +Urine culture. Checking to see if appropriately treated. °

## 2013-01-25 NOTE — ED Notes (Signed)
+   Urine Chart sent to EDP office for review. 

## 2013-01-26 ENCOUNTER — Telehealth (HOSPITAL_COMMUNITY): Payer: Self-pay | Admitting: Emergency Medicine

## 2013-01-26 NOTE — ED Notes (Signed)
Chart returned from EDP office. Per Abigail Harris PA-C, Cipro 250 mg 1 q 12 hrs x 3 days. Disp #6. No refills. °

## 2013-01-28 ENCOUNTER — Telehealth (HOSPITAL_COMMUNITY): Payer: Self-pay | Admitting: Emergency Medicine

## 2013-01-28 NOTE — ED Notes (Signed)
Mailbox not recognized.  Will send letter to [facility] address.

## 2013-02-09 ENCOUNTER — Telehealth (HOSPITAL_COMMUNITY): Payer: Self-pay | Admitting: Emergency Medicine

## 2013-07-13 ENCOUNTER — Ambulatory Visit (INDEPENDENT_AMBULATORY_CARE_PROVIDER_SITE_OTHER): Payer: Medicare Other | Admitting: Family Medicine

## 2013-07-13 ENCOUNTER — Encounter: Payer: Self-pay | Admitting: Family Medicine

## 2013-07-13 VITALS — BP 170/110 | HR 70 | Ht 66.5 in | Wt 167.0 lb

## 2013-07-13 DIAGNOSIS — M542 Cervicalgia: Secondary | ICD-10-CM

## 2013-07-13 DIAGNOSIS — I10 Essential (primary) hypertension: Secondary | ICD-10-CM

## 2013-07-13 MED ORDER — LISINOPRIL-HYDROCHLOROTHIAZIDE 10-12.5 MG PO TABS: 1.0000 | ORAL_TABLET | Freq: Every day | ORAL | Status: DC

## 2013-07-13 MED ORDER — IBUPROFEN 600 MG PO TABS: 600.0000 mg | ORAL_TABLET | Freq: Three times a day (TID) | ORAL | Status: DC | PRN

## 2013-07-13 MED ORDER — CYCLOBENZAPRINE HCL 5 MG PO TABS: 5.0000 mg | ORAL_TABLET | Freq: Three times a day (TID) | ORAL | Status: DC | PRN

## 2013-07-13 NOTE — Patient Instructions (Addendum)
Neck Pain - apply heat to your neck 2-3 times per day for 15 minutes at a time, take ibuprofen 600 mg TID, take Flexeril as needed for muscle spasm  High blood pressure - resume HCTZ-Lisinopril, return to office in 2 weeks for recheck of blood pressure

## 2013-07-14 NOTE — Assessment & Plan Note (Signed)
62 year old female with cervical neck pain that likely represents sequelae of chronic arthritis of the spine with overlying muscle spasm. No red flag symptoms identified today. -Will attempt to obtain x-ray records from urgent care -Will attempt trial of NSAIDs and muscle relaxants -Patient was counseled to perform gentle range of motion exercises of her neck while applying a heating pack -If symptoms not improved could consider additional imaging of the spine.

## 2013-07-14 NOTE — Progress Notes (Signed)
  Subjective:    Patient ID: Natasha Davila, female    DOB: 07-06-1951, 62 y.o.   MRN: [id number]  HPI 62 year old [place] American female presents for followup of multiple medical problems.  Hypertension-patient states that she's been off her blood pressure medications for approximately the past month, this is partially related to her not being able to afford them, she denies current chest pain, no lightheadedness, no vision changes  Neck pain-patient was also recently seen in urgent care for evaluation of neck pain, records are not available for review as they were done at outside facility, patient states he has had off-and-on neck pain for at least the past year, she is a history of left hip osteoarthritis status post hip replacement, patient has noted decreased range of motion in her neck, she also reports a history of numbness and tingling in her right hand and has been off-and-on for the past year, this is unchanged today, no other weakness or numbness of the upper extremities are noted today, patient states she had x-rays of her neck performed at urgent care and was diagnosed with 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 Patient has been on Percocet for her left hip which did help some of her pain, she has not attempted any NSAIDs or muscle relaxants to help control her pain, she is also not attended any physical therapy, does not perform any home neck or upper extremity exercises, the patient also states that she has a "bump" over her neck and would like this evaluated today, the bump is associated with onset of pain  Social-patient is a current every day smoker   Review of Systems  Constitutional: Negative for fever, chills and fatigue.  Respiratory: Negative for apnea, cough and chest tightness.   Cardiovascular: Negative for chest pain and palpitations.   Musculoskeletal: Positive for back pain.  Neurological: Positive for numbness. Negative for seizures, syncope and speech difficulty.       Objective:   Physical Exam Vitals: Reviewed, blood pressure elevated General: Pleasant African American female, appears anxious at times, no acute distress HEENT: Normocephalic, pupils are equal round and reactive to light, extraocular motion intact, moist and there's members, uvula midline, no pharyngeal erythema or exudate noted, neck was supple anteriorly, no anterior posterior cervical lymphadenopathy were appreciated Cardiac: Regular in rhythm, S1-S2, no murmurs Respiratory: Clear to auscultation bilaterally, normal effort MSK: Decreased range of motion of the cervical spine with side-to-side/up and down motion as well as ear to shoulder bilaterally, she does have diffuse cervical tenderness over both the midline and paraspinal areas, the bump that the patient was referred to was actually muscle spasm in the trapezius muscle bilaterally Neuro: Cranial nerves II through XII are intact, muscle strength was 5 out of 5 to shoulder abduction and adduction/arm flexion and extension/grip strength, brachioradialis reflex was 2+, bilateral humerus reflexes were 2+, gross sensation to light touch was preserved in bilateral upper extremities       Assessment & Plan:Please see problem specific assessment and plan.

## 2013-07-14 NOTE — Assessment & Plan Note (Addendum)
Patient has not been compliant with medication regimen. Blood pressure is elevated today. -Will restart patient on home blood pressure medications and followup in 2 weeks -Of note patient states that she may not be able to afford her medications until Monday when she gets her monthly check. Explained to patient that her blood pressure medication is on the four dollar list at local pharmacies and that she may need to borrow funds from friends and family. -Recheck BMP to monitor kidney function and electrolytes at next visit.

## 2013-07-22 ENCOUNTER — Telehealth: Payer: Self-pay | Admitting: Family Medicine

## 2013-07-22 NOTE — Telephone Encounter (Signed)
Forward to PCP to send in similar medication.Natasha Davila, Natasha Davila

## 2013-07-22 NOTE — Telephone Encounter (Signed)
Patient was recently prescribed Flexeril and it is not covered under her insurance. Patient will need Pre-Authorization or another medicine similar to Flexeril. Please call patient when complete

## 2013-07-23 ENCOUNTER — Other Ambulatory Visit: Payer: Self-pay | Admitting: Family Medicine

## 2013-07-23 MED ORDER — METHOCARBAMOL 500 MG PO TABS: 500.0000 mg | ORAL_TABLET | Freq: Four times a day (QID) | ORAL | Status: DC | PRN

## 2013-07-23 NOTE — Telephone Encounter (Signed)
Please make patient aware that I have sent a different muscle relaxant into the pharmacy.

## 2013-07-23 NOTE — Telephone Encounter (Signed)
Pt notified.  Lewanda Perea L, CMA  

## 2013-07-23 NOTE — Telephone Encounter (Signed)
Made change from Flexeril to Methocarbamol due to not covered by insurance.

## 2013-07-27 NOTE — Telephone Encounter (Signed)
Patient calls stating the muscle relaxer Dr. Randolm Idol prescribed for her is not covered under her insurance either. She checked with her insurance company and they said that Diclofenac is cover. She is wondering is this can be prescribed?

## 2013-07-27 NOTE — Telephone Encounter (Signed)
Pt. Notified and is agreeable.  Jenisa Monty, Darlyne Russian, CMA

## 2013-07-27 NOTE — Telephone Encounter (Signed)
Will fwd to Dr.Fletke. Natasha Davila, Natasha Davila

## 2013-07-27 NOTE — Telephone Encounter (Signed)
Please call patient and inform her that Diclofenac is not a muscle relaxant (it is an NSAID), she needs to call her insurance company and see which muscle relaxant would be covered. Thanks.

## 2013-08-03 ENCOUNTER — Encounter: Payer: Self-pay | Admitting: Family Medicine

## 2013-08-03 ENCOUNTER — Ambulatory Visit (INDEPENDENT_AMBULATORY_CARE_PROVIDER_SITE_OTHER): Payer: Medicare Other | Admitting: Family Medicine

## 2013-08-03 VITALS — BP 148/96 | HR 72 | Temp 98.2°F | Ht 66.5 in | Wt 173.0 lb

## 2013-08-03 DIAGNOSIS — M542 Cervicalgia: Secondary | ICD-10-CM

## 2013-08-03 DIAGNOSIS — I10 Essential (primary) hypertension: Secondary | ICD-10-CM

## 2013-08-03 NOTE — Patient Instructions (Addendum)
Neck Pain - I suspect that you have arthritis of the neck, will order MRI of the cervical spin to look for arthritis/nerve impingement  Hypertension/Blood Pressure - slightly elevated, check Blood Pressures 4-5 times per week for the next 2 weeks  Lump on neck/chest - check basic lab work, I have no reason to suspect cancer at this time. Suspect that your neck swelling is related to you neck arthritis.   Return in 2-3 weeks for follow up of blood pressure and MRI neck.

## 2013-08-04 LAB — CBC WITH DIFFERENTIAL/PLATELET
Basophils Absolute: 0 10*3/uL (ref 0.0–0.1)
Eosinophils Absolute: 0.2 10*3/uL (ref 0.0–0.7)
Eosinophils Relative: 3 % (ref 0–5)
MCH: 31.9 pg (ref 26.0–34.0)
MCHC: 34.5 g/dL (ref 30.0–36.0)
MCV: 92.4 fL (ref 78.0–100.0)
Monocytes Absolute: 0.3 10*3/uL (ref 0.1–1.0)
Platelets: 341 10*3/uL (ref 150–400)
RDW: 13 % (ref 11.5–15.5)
WBC: 4.5 10*3/uL (ref 4.0–10.5)

## 2013-08-04 LAB — COMPLETE METABOLIC PANEL WITH GFR
ALT: 42 U/L — ABNORMAL HIGH (ref 0–35)
AST: 52 U/L — ABNORMAL HIGH (ref 0–37)
Albumin: 3.7 g/dL (ref 3.5–5.2)
BUN: 10 mg/dL (ref 6–23)
Calcium: 9.8 mg/dL (ref 8.4–10.5)
Chloride: 104 mEq/L (ref 96–112)
Potassium: 4.2 mEq/L (ref 3.5–5.3)
Sodium: 141 mEq/L (ref 135–145)
Total Protein: 7.7 g/dL (ref 6.0–8.3)

## 2013-08-04 NOTE — Assessment & Plan Note (Signed)
Under better control today however not at goal. Patient is very anxious and may be contributing to elevated blood pressure. - No change to blood pressure mediations today -Patient to check home BP and bring with patient to next visit.

## 2013-08-04 NOTE — Assessment & Plan Note (Signed)
Patient has consistent neck pain with neurologic symptoms/exam findings. -Will send for MRI cervical spine -Continue ibuprofen and flexeril as needed

## 2013-08-04 NOTE — Progress Notes (Signed)
  Subjective:    Patient ID: Natasha Davila, female    DOB: 22-Nov-1950, 62 y.o.   MRN: [id number]  HPI 62 y/o female presents for follow up of multiple medical problems.  HTN - patient has restarted her HCTZ/Lisinopril since last visit, states that she has been compliant with daily dosing, no side effects, denies chest pain, no lightheadedness/dizziness  Neck pain - she continues to have chronic neck pain, associated numbness in right hand (all digits), occasionally drops objects, she is concerned for swelling over the back of her neck, she also reports "swelling/lump" over the front of her chest that has been present since the last visit, painful when she presses on it, reviewed previous office visit for more history of neck pain, no change from previous history, no relief with ibuprofen and flexeril, still has decreased ROM of neck  Social - patient has a history of Bipolar disease, she sees a Veterinary surgeon and psychiatry regularly, she is very concerned about her neck pain and lump on anterior chest    Review of Systems  Constitutional: Negative for chills and fatigue.  Respiratory: Negative for cough and choking.   Cardiovascular: Negative for chest pain.  Gastrointestinal: Negative for nausea and diarrhea.  Genitourinary: Negative for dysuria.  Musculoskeletal: Positive for back pain, neck pain and neck stiffness.       Objective:   Physical Exam Vitals: Reviewed, BP elevated Gen: pleasant AAF, very anxious, keeps rubbing anterior chest during exam Cardiac:RRR, S1 and S2, no murmurs, no heaves/thrills Resp: CTAB, normal effort Neuro: LUE/RUE strength 5/5 to shoulder abduction/adducting, arm flexion/extension, and grip strength. Right biceps reflex 3+, right brachialradialis reflex 2+, left biceps/brachialradialis reflexes 2+, sensation to light touch intact bilaterally, phalens and tinels testing negative MSK: decreased ROM of cervical spine in all directions, no soft tissue mass  appreciated on posterior neck, minimal swelling over anterior chest that is tender to palpation, no erythema noted Psych: very anxious, dressed appropriately, no SI, no HI  Reviewed cervical spine xray report from 06/2013 which was suggestive of moderate to severe cervical spine disease.       Assessment & Plan:  Please see problem specific assessment and plan.   Anterior chest "lump" - very mild exam findings, suspect anxiety and chronic rubbing/irritation causing mild swelling of the area

## 2013-08-05 ENCOUNTER — Encounter: Payer: Self-pay | Admitting: Family Medicine

## 2013-08-10 ENCOUNTER — Ambulatory Visit (HOSPITAL_COMMUNITY)
Admission: RE | Admit: 2013-08-10 | Discharge: 2013-08-10 | Disposition: A | Discharge status: Home / Self Care | Payer: Medicare Other | Source: Ambulatory Visit | Attending: Family Medicine | Admitting: Family Medicine

## 2013-08-10 DIAGNOSIS — M542 Cervicalgia: Secondary | ICD-10-CM

## 2013-08-10 DIAGNOSIS — M47812 Spondylosis without myelopathy or radiculopathy, cervical region: Secondary | ICD-10-CM | POA: Insufficient documentation

## 2013-08-10 DIAGNOSIS — Q762 Congenital spondylolisthesis: Secondary | ICD-10-CM | POA: Insufficient documentation

## 2013-08-10 DIAGNOSIS — M5124 Other intervertebral disc displacement, thoracic region: Secondary | ICD-10-CM | POA: Insufficient documentation

## 2013-08-10 DIAGNOSIS — D1809 Hemangioma of other sites: Secondary | ICD-10-CM | POA: Insufficient documentation

## 2013-08-11 ENCOUNTER — Encounter: Payer: Self-pay | Admitting: Family Medicine

## 2013-08-17 ENCOUNTER — Ambulatory Visit (INDEPENDENT_AMBULATORY_CARE_PROVIDER_SITE_OTHER): Payer: Medicare Other | Admitting: Family Medicine

## 2013-08-17 ENCOUNTER — Encounter: Payer: Self-pay | Admitting: Family Medicine

## 2013-08-17 VITALS — BP 156/90 | HR 91 | Temp 98.0°F | Ht 66.5 in | Wt 169.0 lb

## 2013-08-17 DIAGNOSIS — M47812 Spondylosis without myelopathy or radiculopathy, cervical region: Secondary | ICD-10-CM

## 2013-08-17 DIAGNOSIS — I10 Essential (primary) hypertension: Secondary | ICD-10-CM

## 2013-08-17 MED ORDER — MELOXICAM 7.5 MG PO TABS: 7.5000 mg | ORAL_TABLET | Freq: Every day | ORAL | Status: DC

## 2013-08-17 MED ORDER — LISINOPRIL-HYDROCHLOROTHIAZIDE 20-12.5 MG PO TABS: 1.0000 | ORAL_TABLET | Freq: Every day | ORAL | Status: DC

## 2013-08-17 NOTE — Patient Instructions (Addendum)
Blood Pressure - your lisinopril-HCTZ was increased to 20-25 mg, please pickup your new prescription  Neck pain -your MRI showed degenerative changes of the cervical spine consistent with Arthritis, stop Ibuprofen, start Mobic 7.5 mg daily, a referral has been made to physical therapy  Return to office in 4 weeks to recheck blood pressure, discuss Hepatitis C, and discuss your knee.   Return to office after the new year for yearly preventative/physical exam.

## 2013-08-18 NOTE — Assessment & Plan Note (Signed)
Blood pressure remains uncontrolled despite regular use of current blood pressure medication. -Will increase lisinopril-hydrochlorothiazide to 20-12.5 mg by mouth daily -Will need to check BMP at next visit to monitor creatinine function and electrolytes

## 2013-08-18 NOTE — Progress Notes (Signed)
  Subjective:    Patient ID: Natasha Davila, female    DOB: 1950-11-28, 62 y.o.   MRN: [id number]  HPI 62 year old female presents for evaluation of multiple medical problems  Neck pain-patient recently had MRI of the cervical spine which is outlined below, she continues to have bilateral paraspinal cervical tightness and neck tenderness, she continues to have some mild swelling in the posterior aspect of her neck, she also occasionally has numbness and tingling of the right hand that is unchanged from previous, decreased range of motion of the cervical spine, patient has been taking ibuprofen 2-3 times per day which has provided minimal relief of her pain, she did take Flexeril for a period of time which provided mild relief however she was unable to refill this as is not covered by her insurance, she states that she had better relief of her knee pain with Flexeril compared to her neck pain, she does apply heat to the area on a regular basis, hasn't attempted ice to the area  Hypertension-currently on lisinopril-hydrochlorothiazide 10-12.5 mg by mouth daily, she notes that she's been compliant with his medication, denies side effects, no chest pain, no vision changes, no lightheadedness   Review of Systems  Constitutional: Negative for fever and chills.  Respiratory: Negative for cough, choking and wheezing.   Cardiovascular: Negative for chest pain.  Gastrointestinal: Negative for vomiting, diarrhea and constipation.  Neurological: Negative for dizziness, tremors, syncope, weakness, light-headedness and headaches.       Objective:   Physical Exam Vitals: Reviewed General: Pleasant female, no acute distress, appears slightly anxious HEENT: Normocephalic, pupils are equal round and reactive to light, extraocular movements are intact, moist mucous membrane, neck was supple Cardiac: Regular rate and rhythm, S1 and S2 present, no murmurs, no heaves or thrills Respiratory: Clear to  auscultation bilaterally, normal effort MSK: Patient and shows decreased range of motion of the cervical spine with flexion and extension, side-to-side movement, and ear to shoulder bilaterally, mild swelling present over the C7 area, patient again has bilateral paraspinal tenderness of the cervical spine as well as muscle tension over bilateral trapezius muscles Neuro: Cranial nerves II through XII are intact, strength was 5 out of 5 muscle testing of bilateral upper extremities including abduction/adduction/arm flexion and extension/grip strength, gross sensation to light touch was intact  Reviewed MRI cervical spine-chronic fusion of C4-C5, multilevel degenerative disc disease, mild narrowing of the foramina noted without evidence of compressive stenosis      Assessment & Plan:  Please see problem specific assessment and plan

## 2013-08-18 NOTE — Assessment & Plan Note (Signed)
Patient has multilevel degenerative disc disease without evidence of compressive stenosis, I suspect that her arthritis is contributing to the neck pain that she is having -Will continue conservative management with ice and gentle range of motion -Will prescribe Mobic 7.5 mg daily, patient is to discontinue ibuprofen -Will refer to for physical therapy to help decrease muscle spasm

## 2013-10-14 ENCOUNTER — Other Ambulatory Visit: Payer: Self-pay | Admitting: Family Medicine

## 2013-10-27 ENCOUNTER — Ambulatory Visit: Payer: Medicare Other | Admitting: Family Medicine

## 2014-05-30 IMAGING — CR DG PORTABLE PELVIS
1 series · 1 of 1 positions shown · non-contrast
Comparison: None.

CLINICAL DATA: Left total hip replacement.

PORTABLE PELVIS

[AP]
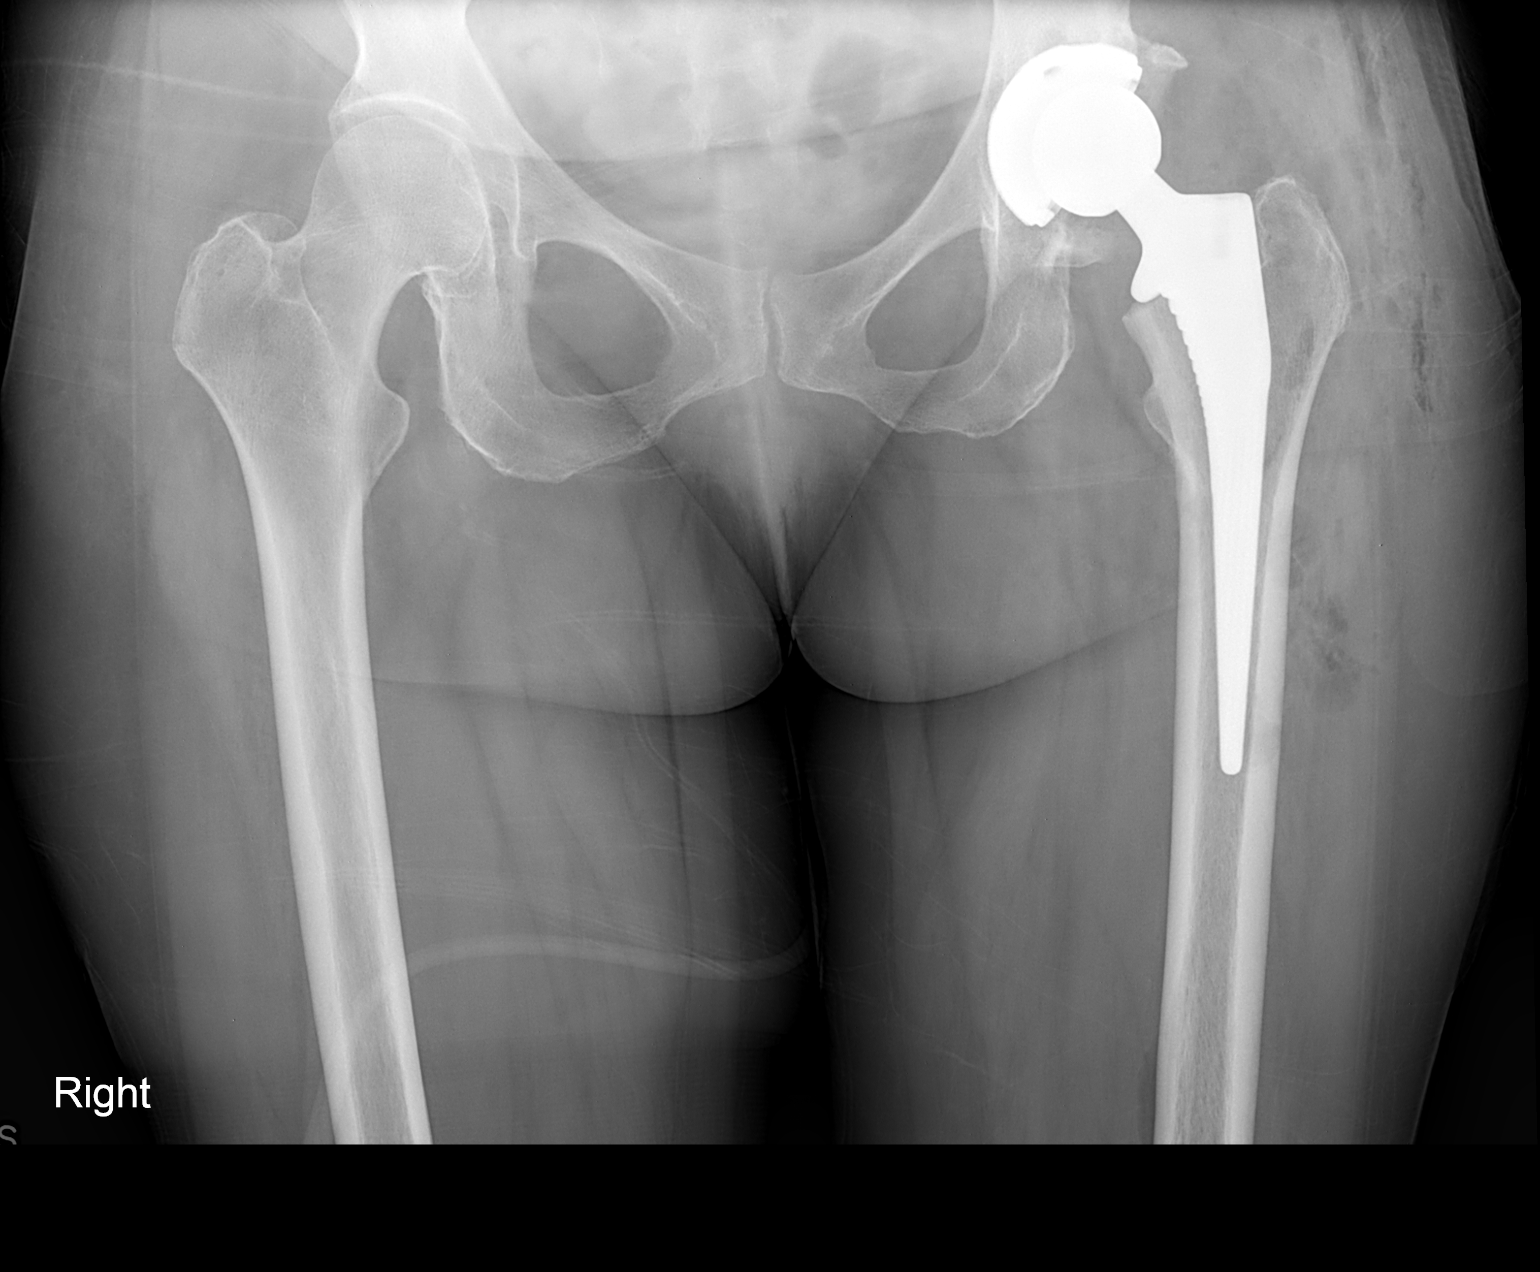

[1 of 1 positions shown; findings below may reference images not displayed]

FINDINGS: AP portable view of the left hip demonstrates that the
acetabular and femoral components of the total hip prosthesis
appear in excellent position in the AP projection. Osteophytes are
seen on the acetabulum.
IMPRESSION: Satisfactory appearance of the left hip in the AP projection after
total hip prosthesis insertion.

## 2014-08-18 ENCOUNTER — Encounter (HOSPITAL_COMMUNITY): Payer: Self-pay | Admitting: Emergency Medicine

## 2014-08-18 ENCOUNTER — Emergency Department (HOSPITAL_COMMUNITY): Payer: Medicare Other

## 2014-08-18 ENCOUNTER — Emergency Department (HOSPITAL_COMMUNITY)
Admission: EM | Admit: 2014-08-18 | Discharge: 2014-08-18 | Disposition: A | Discharge status: Home / Self Care | Payer: Medicare Other | Attending: Emergency Medicine | Admitting: Emergency Medicine

## 2014-08-18 DIAGNOSIS — M1990 Unspecified osteoarthritis, unspecified site: Secondary | ICD-10-CM | POA: Diagnosis not present

## 2014-08-18 DIAGNOSIS — N189 Chronic kidney disease, unspecified: Secondary | ICD-10-CM | POA: Diagnosis not present

## 2014-08-18 DIAGNOSIS — Z862 Personal history of diseases of the blood and blood-forming organs and certain disorders involving the immune mechanism: Secondary | ICD-10-CM | POA: Insufficient documentation

## 2014-08-18 DIAGNOSIS — Z79899 Other long term (current) drug therapy: Secondary | ICD-10-CM | POA: Diagnosis not present

## 2014-08-18 DIAGNOSIS — F319 Bipolar disorder, unspecified: Secondary | ICD-10-CM | POA: Diagnosis not present

## 2014-08-18 DIAGNOSIS — J209 Acute bronchitis, unspecified: Secondary | ICD-10-CM | POA: Diagnosis not present

## 2014-08-18 DIAGNOSIS — R0789 Other chest pain: Secondary | ICD-10-CM | POA: Diagnosis not present

## 2014-08-18 DIAGNOSIS — Z8619 Personal history of other infectious and parasitic diseases: Secondary | ICD-10-CM | POA: Diagnosis not present

## 2014-08-18 DIAGNOSIS — I129 Hypertensive chronic kidney disease with stage 1 through stage 4 chronic kidney disease, or unspecified chronic kidney disease: Secondary | ICD-10-CM | POA: Insufficient documentation

## 2014-08-18 DIAGNOSIS — J40 Bronchitis, not specified as acute or chronic: Secondary | ICD-10-CM

## 2014-08-18 DIAGNOSIS — Z90710 Acquired absence of both cervix and uterus: Secondary | ICD-10-CM | POA: Diagnosis not present

## 2014-08-18 DIAGNOSIS — Z720 Tobacco use: Secondary | ICD-10-CM | POA: Diagnosis not present

## 2014-08-18 DIAGNOSIS — R059 Cough, unspecified: Secondary | ICD-10-CM

## 2014-08-18 DIAGNOSIS — R05 Cough: Secondary | ICD-10-CM

## 2014-08-18 DIAGNOSIS — N390 Urinary tract infection, site not specified: Secondary | ICD-10-CM | POA: Diagnosis not present

## 2014-08-18 LAB — URINALYSIS, ROUTINE W REFLEX MICROSCOPIC
Bilirubin Urine: NEGATIVE
Glucose, UA: NEGATIVE mg/dL
HGB URINE DIPSTICK: NEGATIVE
Ketones, ur: NEGATIVE mg/dL
NITRITE: NEGATIVE
PH: 6.5 (ref 5.0–8.0)
Protein, ur: NEGATIVE mg/dL
SPECIFIC GRAVITY, URINE: 1.009 (ref 1.005–1.030)
UROBILINOGEN UA: 2 mg/dL — AB (ref 0.0–1.0)

## 2014-08-18 LAB — BASIC METABOLIC PANEL
Anion gap: 20 — ABNORMAL HIGH (ref 5–15)
BUN: 12 mg/dL (ref 6–23)
CHLORIDE: 103 meq/L (ref 96–112)
CO2: 16 meq/L — AB (ref 19–32)
Calcium: 9.3 mg/dL (ref 8.4–10.5)
Creatinine, Ser: 1.37 mg/dL — ABNORMAL HIGH (ref 0.50–1.10)
GFR calc non Af Amer: 40 mL/min — ABNORMAL LOW (ref >90)
GFR, EST AFRICAN AMERICAN: 47 mL/min — AB (ref >90)
Glucose, Bld: 162 mg/dL — ABNORMAL HIGH (ref 70–99)
POTASSIUM: 3.3 meq/L — AB (ref 3.7–5.3)
Sodium: 139 mEq/L (ref 137–147)

## 2014-08-18 LAB — URINE MICROSCOPIC-ADD ON

## 2014-08-18 LAB — CBC
HEMATOCRIT: 43 % (ref 36.0–46.0)
HEMOGLOBIN: 15 g/dL (ref 12.0–15.0)
MCH: 31.7 pg (ref 26.0–34.0)
MCHC: 34.9 g/dL (ref 30.0–36.0)
MCV: 90.9 fL (ref 78.0–100.0)
Platelets: 331 10*3/uL (ref 150–400)
RBC: 4.73 MIL/uL (ref 3.87–5.11)
RDW: 12 % (ref 11.5–15.5)
WBC: 5.5 10*3/uL (ref 4.0–10.5)

## 2014-08-18 MED ORDER — SULFAMETHOXAZOLE-TRIMETHOPRIM 800-160 MG PO TABS: 1.0000 | ORAL_TABLET | Freq: Two times a day (BID) | ORAL | Status: DC

## 2014-08-18 MED ORDER — TRAMADOL HCL 50 MG PO TABS
50.0000 mg | ORAL_TABLET | Freq: Once | ORAL | Status: AC
  Administered 2014-08-18: 50 mg via ORAL
  Filled 2014-08-18: qty 1

## 2014-08-18 MED ORDER — TRAMADOL HCL 50 MG PO TABS: 100.0000 mg | ORAL_TABLET | Freq: Four times a day (QID) | ORAL | Status: DC | PRN

## 2014-08-18 MED ORDER — IPRATROPIUM-ALBUTEROL 0.5-2.5 (3) MG/3ML IN SOLN
3.0000 mL | RESPIRATORY_TRACT | Status: DC
  Administered 2014-08-18: 3 mL via RESPIRATORY_TRACT
  Filled 2014-08-18: qty 3

## 2014-08-18 MED ORDER — CEPHALEXIN 500 MG PO CAPS: 500.0000 mg | ORAL_CAPSULE | Freq: Four times a day (QID) | ORAL | Status: DC

## 2014-08-18 NOTE — ED Notes (Signed)
Per pt sts cough, rib pain with coughing, groin pain and HA x a few weeks.

## 2014-08-18 NOTE — ED Notes (Signed)
Dr. Pfeiffer at bedside   

## 2014-08-18 NOTE — ED Provider Notes (Signed)
CSN: [id number]     Arrival date & time 08/18/14  1405 History   First MD Initiated Contact with Patient 08/18/14 1608     Chief Complaint  Patient presents with  . Cough  . Groin Pain  . Headache     (Consider location/radiation/quality/duration/timing/severity/associated sxs/prior Treatment) HPI  Patient has complaint of groin pain or suprapubic pain that has been present for approximately 4 weeks. She denies pain burning or urgency with urination. Patient denies constipation or diarrhea. She denies fevers. She denies nausea or vomiting. The patient has history of abdominal hysterectomy. The pain is exacerbated somewhat by movements and cough.  Patient identifies cough is developed over the past week. She reports that it hurts on both sides of her ribs with an aching quality. He denies any sputum production with cough. She denies any fevers or chills. She also reports she has some generalized headache, aching in quality. No associated visual changes confusion or gait incoordination. The patient does not have shortness of breath. She denies nasal congestion and sinus drainage or sore throat. Patient denies any lower extremity swelling or calf pain. Past Medical History  Diagnosis Date  . Osteoarthritis     s/p L hip arthroplasty 12/2012  . Hypertension   . Bipolar affective disorder   . Chronic kidney disease     papillary necrosis   . Sickle cell trait   . Hepatitis     hepatitis C    Past Surgical History  Procedure Laterality Date  . Cholecystectomy  08/07/11  . Abdominal hysterectomy    . Tubal ligation    . Total hip arthroplasty Left 01/02/2013    Procedure: LEFT TOTAL HIP ARTHROPLASTY ANTERIOR APPROACH;  Surgeon: Mcarthur Rossetti, MD;  Location: [facility];  Service: Orthopedics;  Laterality: Left;  . Cesarean section     History reviewed. No pertinent family history. History  Substance Use Topics  . Smoking status: Current Every Day Smoker -- 0.25 packs/day for 20  years    Types: Cigarettes  . Smokeless tobacco: Never Used  . Alcohol Use: Yes     Comment: occasional    OB History   Grav Para Term Preterm Abortions TAB SAB Ect Mult Living                 Review of Systems  10 Systems reviewed and are negative for acute change except as noted in the HPI.   Allergies  Oxycodone  Home Medications   Prior to Admission medications   Medication Sig Start Date End Date Taking? Authorizing Provider  ibuprofen (ADVIL,MOTRIN) 200 MG tablet Take 400 mg by mouth every 6 (six) hours as needed for headache or mild pain.   Yes Historical Provider, MD  Melatonin 10 MG TABS Take 30 mg by mouth at bedtime as needed (for sleep).   Yes Historical Provider, MD  mirtazapine (REMERON) 30 MG tablet Take 30 mg by mouth daily.   Yes Historical Provider, MD  QUEtiapine (SEROQUEL) 300 MG tablet Take 300 mg by mouth at bedtime.   Yes Historical Provider, MD  sulfamethoxazole-trimethoprim (SEPTRA DS) 800-160 MG per tablet Take 1 tablet by mouth every 12 (twelve) hours. 08/18/14   Charlesetta Shanks, MD  traMADol (ULTRAM) 50 MG tablet Take 2 tablets (100 mg total) by mouth every 6 (six) hours as needed. 08/18/14   Charlesetta Shanks, MD   BP 156/92  Pulse 84  Temp(Src) 98.3 F (36.8 C)  Resp 17  SpO2 95% Physical Exam  Constitutional: She  is oriented to person, place, and time. She appears well-developed and well-nourished.  HENT:  Head: Normocephalic and atraumatic.  Eyes: EOM are normal. Pupils are equal, round, and reactive to light.  Neck: Neck supple.  Cardiovascular: Normal rate, regular rhythm, normal heart sounds and intact distal pulses.   Pulmonary/Chest: Effort normal and breath sounds normal.  Abdominal: Soft. Bowel sounds are normal. She exhibits no distension. There is tenderness.  Patient has mild suprapubic tenderness to palpation. There is no guarding no mass. Inguinal area does not show any mass or swelling.  Musculoskeletal: Normal range of motion.  She exhibits no edema.  Neurological: She is alert and oriented to person, place, and time. She has normal strength. Coordination normal. GCS eye subscore is 4. GCS verbal subscore is 5. GCS motor subscore is 6.  Skin: Skin is warm, dry and intact.  Psychiatric: She has a normal mood and affect.    ED Course  Procedures (including critical care time) Labs Review Labs Reviewed  BASIC METABOLIC PANEL - Abnormal; Notable for the following:    Potassium 3.3 (*)    CO2 16 (*)    Glucose, Bld 162 (*)    Creatinine, Ser 1.37 (*)    GFR calc non Af Amer 40 (*)    GFR calc Af Amer 47 (*)    Anion gap 20 (*)    All other components within normal limits  URINALYSIS, ROUTINE W REFLEX MICROSCOPIC - Abnormal; Notable for the following:    Color, Urine AMBER (*)    APPearance CLOUDY (*)    Urobilinogen, UA 2.0 (*)    Leukocytes, UA LARGE (*)    All other components within normal limits  URINE MICROSCOPIC-ADD ON - Abnormal; Notable for the following:    Squamous Epithelial / LPF FEW (*)    Bacteria, UA FEW (*)    All other components within normal limits  URINE CULTURE  CBC    Imaging Review Dg Chest 2 View  08/18/2014   CLINICAL DATA:  Cough.  Rib pain.  EXAM: CHEST  2 VIEW  COMPARISON:  03/24/2012  FINDINGS: Heart size and pulmonary vascularity are normal. There is peribronchial thickening. Lungs are otherwise clear. No effusions. No acute osseous abnormality. Reversal of the lower thoracic kyphosis is chronic.  IMPRESSION: Bronchitic changes.   Electronically Signed   By: Rozetta Nunnery M.D.   On: 08/18/2014 15:54     EKG Interpretation None      MDM   Final diagnoses:  Bronchitis  Chest wall pain  UTI (lower urinary tract infection)   At this point time patient's symptoms are most consistent with a acute bronchitis type picture. She has muscular skeletal aching quality pain on both sides of the chest wall. She has coughed presents as I am examining her in the room. No fever or  leukocytosis or focal infiltrate to suggest an acute pneumonia. Patient does complain of some suprapubic discomfort that has been present for several weeks preceding the symptoms. She has had a hysterectomy thus pelvic infection is of very low probability she has not had fever or diarrhea to suggest colitis. At this point UA is positive for white cells and leukoesterase. Based on the patient's symptoms she will be treated with Bactrim and a culture is obtained. The patient is well in appearance stable vital signs and is advised for recheck with her family physician in the next 3-5 days.    Charlesetta Shanks, MD 08/18/14 [phone]

## 2014-08-18 NOTE — Discharge Instructions (Signed)
Urinary Tract Infection °Urinary tract infections (UTIs) can develop anywhere along your urinary tract. Your urinary tract is your body's drainage system for removing wastes and extra water. Your urinary tract includes two kidneys, two ureters, a bladder, and a urethra. Your kidneys are a pair of bean-shaped organs. Each kidney is about the size of your fist. They are located below your ribs, one on each side of your spine. °CAUSES °Infections are caused by microbes, which are microscopic organisms, including fungi, viruses, and bacteria. These organisms are so small that they can only be seen through a microscope. Bacteria are the microbes that most commonly cause UTIs. °SYMPTOMS  °Symptoms of UTIs may vary by age and gender of the patient and by the location of the infection. Symptoms in young women typically include a frequent and intense urge to urinate and a painful, burning feeling in the bladder or urethra during urination. Older women and men are more likely to be tired, shaky, and weak and have muscle aches and abdominal pain. A fever may mean the infection is in your kidneys. Other symptoms of a kidney infection include pain in your back or sides below the ribs, nausea, and vomiting. °DIAGNOSIS °To diagnose a UTI, your caregiver will ask you about your symptoms. Your caregiver also will ask to provide a urine sample. The urine sample will be tested for bacteria and white blood cells. White blood cells are made by your body to help fight infection. °TREATMENT  °Typically, UTIs can be treated with medication. Because most UTIs are caused by a bacterial infection, they usually can be treated with the use of antibiotics. The choice of antibiotic and length of treatment depend on your symptoms and the type of bacteria causing your infection. °HOME CARE INSTRUCTIONS °· If you were prescribed antibiotics, take them exactly as your caregiver instructs you. Finish the medication even if you feel better after you  have only taken some of the medication. °· Drink enough water and fluids to keep your urine clear or pale yellow. °· Avoid caffeine, tea, and carbonated beverages. They tend to irritate your bladder. °· Empty your bladder often. Avoid holding urine for long periods of time. °· Empty your bladder before and after sexual intercourse. °· After a bowel movement, women should cleanse from front to back. Use each tissue only once. °SEEK MEDICAL CARE IF:  °· You have back pain. °· You develop a fever. °· Your symptoms do not begin to resolve within 3 days. °SEEK IMMEDIATE MEDICAL CARE IF:  °· You have severe back pain or lower abdominal pain. °· You develop chills. °· You have nausea or vomiting. °· You have continued burning or discomfort with urination. °MAKE SURE YOU:  °· Understand these instructions. °· Will watch your condition. °· Will get help right away if you are not doing well or get worse. °Document Released: 07/18/2005 Document Revised: 04/08/2012 Document Reviewed: 11/16/2011 °Exit[employer]® Patient Information ©2015 [employer]. This information is not intended to replace advice given to you by your health care provider. Make sure you discuss any questions you have with your health care provider. °Acute Bronchitis °Bronchitis is inflammation of the airways that extend from the windpipe into the lungs (bronchi). The inflammation often causes mucus to develop. This leads to a cough, which is the most common symptom of bronchitis.  °In acute bronchitis, the condition usually develops suddenly and goes away over time, usually in a couple weeks. Smoking, allergies, and asthma can make bronchitis worse. Repeated episodes of   bronchitis may cause further lung problems.  °CAUSES °Acute bronchitis is most often caused by the same virus that causes a cold. The virus can spread from person to person (contagious) through coughing, sneezing, and touching contaminated objects. °SIGNS AND SYMPTOMS  °· Cough.   °· Fever.    °· Coughing up mucus.   °· Body aches.   °· Chest congestion.   °· Chills.   °· Shortness of breath.   °· Sore throat.   °DIAGNOSIS  °Acute bronchitis is usually diagnosed through a physical exam. Your health care provider will also ask you questions about your medical history. Tests, such as chest X-rays, are sometimes done to rule out other conditions.  °TREATMENT  °Acute bronchitis usually goes away in a couple weeks. Oftentimes, no medical treatment is necessary. Medicines are sometimes given for relief of fever or cough. Antibiotic medicines are usually not needed but may be prescribed in certain situations. In some cases, an inhaler may be recommended to help reduce shortness of breath and control the cough. A cool mist vaporizer may also be used to help thin bronchial secretions and make it easier to clear the chest.  °HOME CARE INSTRUCTIONS °· Get plenty of rest.   °· Drink enough fluids to keep your urine clear or pale yellow (unless you have a medical condition that requires fluid restriction). Increasing fluids may help thin your respiratory secretions (sputum) and reduce chest congestion, and it will prevent dehydration.   °· Take medicines only as directed by your health care provider. °· If you were prescribed an antibiotic medicine, finish it all even if you start to feel better. °· Avoid smoking and secondhand smoke. Exposure to cigarette smoke or irritating chemicals will make bronchitis worse. If you are a smoker, consider using nicotine gum or skin patches to help control withdrawal symptoms. Quitting smoking will help your lungs heal faster.   °· Reduce the chances of another bout of acute bronchitis by washing your hands frequently, avoiding people with cold symptoms, and trying not to touch your hands to your mouth, nose, or eyes.   °· Keep all follow-up visits as directed by your health care provider.   °SEEK MEDICAL CARE IF: °Your symptoms do not improve after 1 week of treatment.  °SEEK  IMMEDIATE MEDICAL CARE IF: °· You develop an increased fever or chills.   °· You have chest pain.   °· You have severe shortness of breath. °· You have bloody sputum.   °· You develop dehydration. °· You faint or repeatedly feel like you are going to pass out. °· You develop repeated vomiting. °· You develop a severe headache. °MAKE SURE YOU:  °· Understand these instructions. °· Will watch your condition. °· Will get help right away if you are not doing well or get worse. °Document Released: 11/15/2004 Document Revised: 02/22/2014 Document Reviewed: 03/31/2013 °ExitCare® Patient Information ©2015 [employer]. This information is not intended to replace advice given to you by your health care provider. Make sure you discuss any questions you have with your health care provider. ° °

## 2014-08-19 LAB — URINE CULTURE
Colony Count: NO GROWTH
Culture: NO GROWTH

## 2015-06-08 ENCOUNTER — Telehealth: Payer: Self-pay | Admitting: Family Medicine

## 2015-06-08 ENCOUNTER — Encounter: Payer: Self-pay | Admitting: Family Medicine

## 2015-06-08 DIAGNOSIS — K921 Melena: Secondary | ICD-10-CM | POA: Insufficient documentation

## 2015-06-08 NOTE — Telephone Encounter (Signed)
Pt scheduled for a cpe next week. Deseree Kennon Holter, CMA

## 2015-06-08 NOTE — Telephone Encounter (Signed)
Received lab result from [facility] that showed positive fecal occult blood. I contacted the patient and discussed these results with with. She denies gross blood. Patient due for wellness visit. Will schedule and discuss results in more detail (will need referral to GI for colonoscopy given age).  RN staff - please contact patient to schedule physical

## 2015-06-13 ENCOUNTER — Other Ambulatory Visit: Payer: Self-pay | Admitting: Family Medicine

## 2015-06-13 ENCOUNTER — Encounter: Payer: Self-pay | Admitting: Family Medicine

## 2015-06-13 ENCOUNTER — Other Ambulatory Visit: Payer: Self-pay

## 2015-06-13 ENCOUNTER — Ambulatory Visit (INDEPENDENT_AMBULATORY_CARE_PROVIDER_SITE_OTHER): Payer: Medicare Other | Admitting: Family Medicine

## 2015-06-13 VITALS — BP 134/94 | HR 110 | Temp 98.2°F | Ht 67.0 in | Wt 182.0 lb

## 2015-06-13 DIAGNOSIS — Z0000 Encounter for general adult medical examination without abnormal findings: Secondary | ICD-10-CM

## 2015-06-13 DIAGNOSIS — Z720 Tobacco use: Secondary | ICD-10-CM | POA: Insufficient documentation

## 2015-06-13 DIAGNOSIS — Z1231 Encounter for screening mammogram for malignant neoplasm of breast: Secondary | ICD-10-CM

## 2015-06-13 DIAGNOSIS — Z23 Encounter for immunization: Secondary | ICD-10-CM | POA: Diagnosis not present

## 2015-06-13 DIAGNOSIS — K921 Melena: Secondary | ICD-10-CM | POA: Diagnosis not present

## 2015-06-13 DIAGNOSIS — K5900 Constipation, unspecified: Secondary | ICD-10-CM | POA: Diagnosis not present

## 2015-06-13 DIAGNOSIS — I10 Essential (primary) hypertension: Secondary | ICD-10-CM | POA: Diagnosis not present

## 2015-06-13 DIAGNOSIS — B1920 Unspecified viral hepatitis C without hepatic coma: Secondary | ICD-10-CM | POA: Diagnosis not present

## 2015-06-13 LAB — COMPREHENSIVE METABOLIC PANEL
ALBUMIN: 3.6 g/dL (ref 3.6–5.1)
ALK PHOS: 99 U/L (ref 33–130)
ALT: 56 U/L — AB (ref 6–29)
AST: 69 U/L — AB (ref 10–35)
BILIRUBIN TOTAL: 0.9 mg/dL (ref 0.2–1.2)
BUN: 19 mg/dL (ref 7–25)
CO2: 22 mmol/L (ref 20–31)
CREATININE: 1.26 mg/dL — AB (ref 0.50–0.99)
Calcium: 9.9 mg/dL (ref 8.6–10.4)
Chloride: 105 mmol/L (ref 98–110)
Glucose, Bld: 110 mg/dL — ABNORMAL HIGH (ref 65–99)
Potassium: 4.2 mmol/L (ref 3.5–5.3)
SODIUM: 140 mmol/L (ref 135–146)
TOTAL PROTEIN: 8.4 g/dL — AB (ref 6.1–8.1)

## 2015-06-13 LAB — CBC
HCT: 43.6 % (ref 36.0–46.0)
Hemoglobin: 14.8 g/dL (ref 12.0–15.0)
MCH: 31.4 pg (ref 26.0–34.0)
MCHC: 33.9 g/dL (ref 30.0–36.0)
MCV: 92.6 fL (ref 78.0–100.0)
MPV: 9 fL (ref 8.6–12.4)
PLATELETS: 412 10*3/uL — AB (ref 150–400)
RBC: 4.71 MIL/uL (ref 3.87–5.11)
RDW: 12.4 % (ref 11.5–15.5)
WBC: 5.2 10*3/uL (ref 4.0–10.5)

## 2015-06-13 LAB — LIPID PANEL
CHOLESTEROL: 140 mg/dL (ref 125–200)
HDL: 39 mg/dL — ABNORMAL LOW (ref >=46)
LDL Cholesterol: 70 mg/dL (ref <130)
Total CHOL/HDL Ratio: 3.6 Ratio (ref <=5.0)
Triglycerides: 154 mg/dL — ABNORMAL HIGH (ref <150)
VLDL: 31 mg/dL — ABNORMAL HIGH (ref <30)

## 2015-06-13 LAB — HEMOCCULT GUIAC POC 1CARD (OFFICE): FECAL OCCULT BLD: NEGATIVE

## 2015-06-13 MED ORDER — POLYETHYLENE GLYCOL 3350 17 GM/SCOOP PO POWD: 17.0000 g | Freq: Two times a day (BID) | ORAL | Status: AC | PRN

## 2015-06-13 NOTE — Assessment & Plan Note (Signed)
Previous outside testing positive for blood in her stool. Hemoccult-negative today. Suspect blood may have been due to intermittent constipation. -Referral to GI for colonoscopy

## 2015-06-13 NOTE — Assessment & Plan Note (Signed)
Current every day smoker. Smokes 5 cigarettes per day. -Encourage smoking cessation -Patient to follow-up with pharmacy to have spirometry

## 2015-06-13 NOTE — Assessment & Plan Note (Signed)
Patient has history of hepatitis C -Repeat hepatitis C RNA Quant today -Check CMP to monitor liver function

## 2015-06-13 NOTE — Patient Instructions (Addendum)
It was nice to see you today.  Please start to walk 30 minutes 3 times per week.  Attempt to eat more fruits and vegetables. Also attempt to eat 2-3 servings of dairy per day.  Please attempt to cut down on your smoking. Replace each cigarrette with a piece of hard candy.   Please go to have your Mammogram. Sheet provided  Constipation - start Miralax daily  Dr. Ree Kida will call you with you lab results.  You have been referred to a stomach doctor for a colonoscopy, my office staff will contact you to schedule this.   Make a follow up appointment with Dr. Ree Kida in 2 weeks  Make a follow up with Dr. Valentina Lucks (pharmacy) for a lung test (spirometry).

## 2015-06-13 NOTE — Progress Notes (Signed)
64 y.o. year old female presents for well woman/preventative visit and annual GYN examination.  Acute Concerns:  1. Blood in stool - patient had recent fecal occult blood performed by [facility] which was positive, patient denies acute blood in stool, she does report frequent constipation, last BM was 7 days ago, she sometimes will go 7-10 days without a BM, she is associated bilateral lower quadrant abdominal pain, no nausea or vomiting, tolerating diet, no previous colonoscopy  2. History of hepatitis C - patient requests repeat hepatitis C testing  Diet: Patient reports a poor diet, 0-1 servings of fruit per day, 0-1 servings of vegetables per day, does not eat red meat, very infrequent consumption of dairy products, patient reports eating 1 meal per day, she does not eat breakfast or lunch, dinner typically consists of fried chicken and salad  Exercise: She does not exercise regularly  Sexual/Birth History: Patient is currently celibate, denies breast issues, checks her breasts monthly, no nipple discharge mental vaginal irritation or discharge  Birth Control: Currently celibate, postmenopausal, previous partial hysterectomy  POA/Living Will: Not discussed today  Social:  Social History   Social History  . Marital Status: Widowed    Spouse Name: N/A  . Number of Children: N/A  . Years of Education: N/A   Social History Main Topics  . Smoking status: Current Every Day Smoker -- 0.25 packs/day for 20 years    Types: Cigarettes  . Smokeless tobacco: Never Used  . Alcohol Use: Yes     Comment: occasional   . Drug Use: No  . Sexual Activity: Not Asked   Other Topics Concern  . None   Social History Narrative   Originally from [place], [place].  Moved to [place] around 4 years ago.  Lives alone but has friends and family that help out.     Patient continues to smoke 5 cigarettes per day, she is interested in smoking cessation She reports that she is currently celibate (no sexual  activity for the past 4 years)  Immunization:  Tdap/TD: Patient due for Td up however declines today  Influenza: Needs flu vaccine in the fall  Pneumococcal: No previous pneumococcal vaccine  Herpes Zoster: Completed in 2015  Cancer Screening:  Pap Smear: Not a candidate due to previous partial hysterectomy  Mammogram: Due for mammogram  Colonoscopy: Due for colonoscopy  Physical Exam: VITALS: Reviewed GEN: Pleasant female, no acute distress, mildly anxious HEENT: Normocephalic, bilateral TMs are pearly-gray, pupils equal round and reactive to light, extraocular movements are intact, no scleral icterus, moist mucous members, neck was supple, no thyromegaly, no anterior posterior cervical lymphadenopathy CARDIAC: Regular rate and rhythm, S1 and S2 present, no murmurs, no heaves or thrills RESP: Slightly diminished breath sounds in all air fields, no increased work of breathing BREAST: Deferred as patient has no breast issues at this time ABD: Soft, mild right lower quadrant tenderness, no distention, no hepatosplenomegaly, no rebound, no guarding GU/GYN: Deferred as patient has no current issues and history of partial hysterectomy  EXT: No edema SKIN: Warm, no rash, no suspicious lesions identified Rectal: Small rectal tag at 6:00 position, no external or internal hemorrhoids, no gross mass, Hemoccult-negative  ASSESSMENT & PLAN: 64 y.o. female presents for annual well woman/preventative exam and GYN exam. Please see problem specific assessment and plan.

## 2015-06-13 NOTE — Assessment & Plan Note (Signed)
Patient has constipation which may be leading to positive fecal occult blood intermittent abdominal pain -Start Miralax daily

## 2015-06-13 NOTE — Addendum Note (Signed)
Addended by: [name] on: 06/13/2015 11:36 AM   Modules accepted: Orders

## 2015-06-13 NOTE — Assessment & Plan Note (Signed)
Blood pressure mildly elevated today. -Recheck at next visit and if still elevated initiate antihypertensives therapy

## 2015-06-13 NOTE — Assessment & Plan Note (Signed)
64 year old female presents for annual preventative visit -Discussed lifestyle modifications -Patient declined Tdap -23 Valent pneumonia vaccine provided -Due for mammogram, given information on how to make appointment at [facility] imaging -Due for colonoscopy, referral to GI provided given recent positive fecal occult blood -Not a candidate for Pap smear due to previous partial hysterectomy -Return in one year for annual visit

## 2015-06-14 LAB — HIV ANTIBODY (ROUTINE TESTING W REFLEX): HIV 1&2 Ab, 4th Generation: NONREACTIVE

## 2015-06-14 LAB — HEPATITIS C RNA QUANTITATIVE
HCV QUANT LOG: 5.82 {Log} — AB (ref <1.18)
HCV Quantitative: 656822 IU/mL — ABNORMAL HIGH (ref <15)

## 2015-06-15 ENCOUNTER — Telehealth: Payer: Self-pay | Admitting: Family Medicine

## 2015-06-15 NOTE — Telephone Encounter (Signed)
Called and discussed multiple lab results with patient. Lipids Profile - looks good, HDL low and recommended exercise CMP - liver enzymes elevated (due to hep C) and Scr mildly elevated (will work up at next visit) Fecal occult negative Hep C RNA quant very high (check genotype and patient to discuss with GI physician at upcoming appointment) CBC looks ok

## 2015-06-17 ENCOUNTER — Ambulatory Visit
Admission: RE | Admit: 2015-06-17 | Discharge: 2015-06-17 | Disposition: A | Discharge status: Home / Self Care | Payer: Medicare Other | Source: Ambulatory Visit

## 2015-06-17 DIAGNOSIS — Z1231 Encounter for screening mammogram for malignant neoplasm of breast: Secondary | ICD-10-CM

## 2015-06-17 LAB — HEPATITIS C GENOTYPE

## 2015-06-30 ENCOUNTER — Ambulatory Visit: Payer: Self-pay | Admitting: Pharmacist

## 2015-06-30 ENCOUNTER — Ambulatory Visit: Payer: Self-pay | Admitting: Family Medicine

## 2015-07-06 ENCOUNTER — Other Ambulatory Visit: Payer: Self-pay | Admitting: Physician Assistant

## 2015-07-06 DIAGNOSIS — R945 Abnormal results of liver function studies: Principal | ICD-10-CM

## 2015-07-06 DIAGNOSIS — R7989 Other specified abnormal findings of blood chemistry: Secondary | ICD-10-CM

## 2015-07-06 DIAGNOSIS — K921 Melena: Secondary | ICD-10-CM | POA: Diagnosis not present

## 2015-07-06 DIAGNOSIS — K5900 Constipation, unspecified: Secondary | ICD-10-CM | POA: Diagnosis not present

## 2015-07-06 DIAGNOSIS — R740 Nonspecific elevation of levels of transaminase and lactic acid dehydrogenase [LDH]: Secondary | ICD-10-CM | POA: Diagnosis not present

## 2015-07-06 DIAGNOSIS — B1920 Unspecified viral hepatitis C without hepatic coma: Secondary | ICD-10-CM | POA: Diagnosis not present

## 2015-07-07 ENCOUNTER — Ambulatory Visit: Payer: Self-pay | Admitting: Pharmacist

## 2015-07-07 ENCOUNTER — Ambulatory Visit: Payer: Self-pay | Admitting: Family Medicine

## 2015-07-08 ENCOUNTER — Ambulatory Visit: Payer: Self-pay | Admitting: Family Medicine

## 2015-07-08 ENCOUNTER — Inpatient Hospital Stay: Admission: RE | Admit: 2015-07-08 | Payer: Self-pay | Source: Ambulatory Visit

## 2015-07-13 ENCOUNTER — Other Ambulatory Visit (HOSPITAL_COMMUNITY): Payer: Self-pay | Admitting: Gastroenterology

## 2015-07-13 DIAGNOSIS — B1920 Unspecified viral hepatitis C without hepatic coma: Secondary | ICD-10-CM

## 2015-07-14 ENCOUNTER — Other Ambulatory Visit: Payer: Self-pay

## 2015-07-19 ENCOUNTER — Other Ambulatory Visit: Payer: Self-pay | Admitting: Gastroenterology

## 2015-07-19 DIAGNOSIS — R195 Other fecal abnormalities: Secondary | ICD-10-CM | POA: Diagnosis not present

## 2015-07-19 DIAGNOSIS — D122 Benign neoplasm of ascending colon: Secondary | ICD-10-CM | POA: Diagnosis not present

## 2015-07-19 DIAGNOSIS — D126 Benign neoplasm of colon, unspecified: Secondary | ICD-10-CM | POA: Diagnosis not present

## 2015-07-19 DIAGNOSIS — Z1211 Encounter for screening for malignant neoplasm of colon: Secondary | ICD-10-CM | POA: Diagnosis not present

## 2015-07-19 DIAGNOSIS — K648 Other hemorrhoids: Secondary | ICD-10-CM | POA: Diagnosis not present

## 2015-07-19 DIAGNOSIS — D124 Benign neoplasm of descending colon: Secondary | ICD-10-CM | POA: Diagnosis not present

## 2015-07-22 ENCOUNTER — Encounter: Payer: Self-pay | Admitting: Family Medicine

## 2015-07-22 DIAGNOSIS — D126 Benign neoplasm of colon, unspecified: Secondary | ICD-10-CM | POA: Insufficient documentation

## 2015-08-02 ENCOUNTER — Ambulatory Visit (HOSPITAL_COMMUNITY): Admission: RE | Admit: 2015-08-02 | Payer: Medicare Other | Source: Ambulatory Visit

## 2015-08-08 ENCOUNTER — Telehealth: Payer: Self-pay | Admitting: *Deleted

## 2015-08-08 NOTE — Telephone Encounter (Signed)
Alyse Low, NP with [employer] called stating she completed a home visit today.  Patient had 3+ protein in her urine, diminished breath sounds.  Patient told her she is only smoking 5 cigarettes a day. Patent also stopped stating her lisinopril and HCTZ several months ago.  Please call with questions [phone].  Derl Barrow, RN

## 2015-08-09 NOTE — Telephone Encounter (Signed)
RN staff - please call patient to schedule follow up visit, thanks

## 2015-08-09 NOTE — Telephone Encounter (Signed)
Pt scheduled for appt. Deseree Kennon Holter, CMA

## 2015-08-18 ENCOUNTER — Ambulatory Visit (HOSPITAL_COMMUNITY)
Admission: RE | Admit: 2015-08-18 | Discharge: 2015-08-18 | Disposition: A | Discharge status: Home / Self Care | Payer: Medicare Other | Source: Ambulatory Visit | Attending: Gastroenterology | Admitting: Gastroenterology

## 2015-08-18 ENCOUNTER — Encounter (HOSPITAL_COMMUNITY): Payer: Self-pay | Admitting: Emergency Medicine

## 2015-08-18 ENCOUNTER — Emergency Department (HOSPITAL_COMMUNITY)
Admission: EM | Admit: 2015-08-18 | Discharge: 2015-08-18 | Disposition: A | Discharge status: Home / Self Care | Payer: Medicare Other | Attending: Emergency Medicine | Admitting: Emergency Medicine

## 2015-08-18 DIAGNOSIS — Z8739 Personal history of other diseases of the musculoskeletal system and connective tissue: Secondary | ICD-10-CM | POA: Insufficient documentation

## 2015-08-18 DIAGNOSIS — N189 Chronic kidney disease, unspecified: Secondary | ICD-10-CM | POA: Insufficient documentation

## 2015-08-18 DIAGNOSIS — I10 Essential (primary) hypertension: Secondary | ICD-10-CM | POA: Insufficient documentation

## 2015-08-18 DIAGNOSIS — Z760 Encounter for issue of repeat prescription: Secondary | ICD-10-CM | POA: Diagnosis not present

## 2015-08-18 DIAGNOSIS — B182 Chronic viral hepatitis C: Secondary | ICD-10-CM | POA: Insufficient documentation

## 2015-08-18 DIAGNOSIS — N281 Cyst of kidney, acquired: Secondary | ICD-10-CM

## 2015-08-18 DIAGNOSIS — Z862 Personal history of diseases of the blood and blood-forming organs and certain disorders involving the immune mechanism: Secondary | ICD-10-CM | POA: Diagnosis not present

## 2015-08-18 DIAGNOSIS — F319 Bipolar disorder, unspecified: Secondary | ICD-10-CM | POA: Diagnosis not present

## 2015-08-18 DIAGNOSIS — B1920 Unspecified viral hepatitis C without hepatic coma: Secondary | ICD-10-CM

## 2015-08-18 DIAGNOSIS — I159 Secondary hypertension, unspecified: Secondary | ICD-10-CM | POA: Diagnosis not present

## 2015-08-18 DIAGNOSIS — I129 Hypertensive chronic kidney disease with stage 1 through stage 4 chronic kidney disease, or unspecified chronic kidney disease: Secondary | ICD-10-CM | POA: Diagnosis present

## 2015-08-18 DIAGNOSIS — Z8619 Personal history of other infectious and parasitic diseases: Secondary | ICD-10-CM | POA: Diagnosis not present

## 2015-08-18 DIAGNOSIS — Z9049 Acquired absence of other specified parts of digestive tract: Secondary | ICD-10-CM | POA: Insufficient documentation

## 2015-08-18 DIAGNOSIS — F17210 Nicotine dependence, cigarettes, uncomplicated: Secondary | ICD-10-CM | POA: Diagnosis not present

## 2015-08-18 MED ORDER — QUETIAPINE FUMARATE 300 MG PO TABS: 300.0000 mg | ORAL_TABLET | Freq: Every day | ORAL | Status: AC

## 2015-08-18 NOTE — ED Provider Notes (Signed)
CSN: [id number]     Arrival date & time 08/18/15  1045 History  By signing my name below, I, Natasha Davila, attest that this documentation has been prepared under the direction and in the presence of Natasha Glazier, PA-C Electronically Signed: Soijett Davila, ED Scribe. 08/18/2015. 11:04 AM.   Chief Complaint  Patient presents with  . Medication Refill      The history is provided by the patient. No language interpreter was used.   HPI Comments: Natasha Davila is a 64 y.o. female with a medical hx of bipolar affective disorder and HTN, who presents to the Emergency Department complaining of medication refill onset this morning. She notes that she has ran out of her seroquel that she takes 300 mg once a day before bed that she uses for her sleep disorder, bipolar affective disorder, and depression. She reports that she has an appointment with [facility] on 08/30/2015 and she would like a refill to last her until her appointment. She notes that she is not on her HTN medications but she should be this week. She reports that she does not have any other complaints. She states that she has tried seroquel last night with no relief for her symptoms. She denies hallucinations and any other symptoms.   Past Medical History  Diagnosis Date  . Osteoarthritis     s/p L hip arthroplasty 12/2012  . Hypertension   . Bipolar affective disorder ([place])   . Chronic kidney disease     papillary necrosis   . Sickle cell trait ([place])   . Hepatitis     hepatitis C    Past Surgical History  Procedure Laterality Date  . Cholecystectomy  08/07/11  . Abdominal hysterectomy    . Tubal ligation    . Total hip arthroplasty Left 01/02/2013    Procedure: LEFT TOTAL HIP ARTHROPLASTY ANTERIOR APPROACH;  Surgeon: Mcarthur Rossetti, MD;  Location: [facility] ORS;  Service: Orthopedics;  Laterality: Left;  . Cesarean section     No family history on file. Social History  Substance Use Topics  . Smoking status: Current  Every Day Smoker -- 0.25 packs/day for 20 years    Types: Cigarettes  . Smokeless tobacco: Never Used  . Alcohol Use: Yes     Comment: occasional    OB History    No data available     Review of Systems  Psychiatric/Behavioral: Negative for hallucinations.  All other systems reviewed and are negative.     Allergies  Oxycodone  Home Medications   Prior to Admission medications   Medication Sig Start Date End Date Taking? Authorizing Provider  Melatonin 10 MG TABS Take 30 mg by mouth at bedtime as needed (for sleep).    Historical Provider, MD  polyethylene glycol powder (GLYCOLAX/MIRALAX) powder Take 17 g by mouth 2 (two) times daily as needed. 06/13/15   [name], MD  QUEtiapine (SEROQUEL) 300 MG tablet Take 1 tablet (300 mg total) by mouth at bedtime. 08/18/15   Oswin Johal Patel-Mills, PA-C   BP 155/105 mmHg  Pulse 95  Temp(Src) 98.6 F (37 C) (Oral)  Resp 16  SpO2 100% Physical Exam  Constitutional: She is oriented to person, place, and time. She appears well-developed and well-nourished. No distress.  HENT:  Head: Normocephalic and atraumatic.  Eyes: Conjunctivae and EOM are normal.  Neck: Neck supple.  Cardiovascular: Normal rate.   Pulmonary/Chest: Effort normal. No respiratory distress.  Abdominal: Soft.  Musculoskeletal: Normal range of motion.  Neurological: She  is alert and oriented to person, place, and time.  Skin: Skin is warm and dry.  Psychiatric: She has a normal mood and affect. Her behavior is normal.  Nursing note and vitals reviewed.   ED Course  Procedures (including critical care time) DIAGNOSTIC STUDIES: Oxygen Saturation is 100% on RA, nl by my interpretation.    COORDINATION OF CARE: 11:02 AM Discussed treatment plan with pt at bedside which includes seroquel Rx refill and pt agreed to plan.    Labs Review Labs Reviewed - No data to display  Imaging Review US Abdomen Complete W/elastography  08/18/2015  CLINICAL DATA:  Chronic  hepatitis-C.  Hypertension. EXAM: ULTRASOUND ABDOMEN COMPLETE ULTRASOUND HEPATIC ELASTOGRAPHY TECHNIQUE: Sonography of the upper abdomen was performed. In addition, ultrasound elastography evaluation of the liver was performed. A region of interest was placed within the right lobe of the liver. Following application of a compressive sonographic pulse, shear waves were detected in the adjacent hepatic tissue and the shear wave velocity was calculated. Multiple assessments were performed at the selected site. Median shear wave velocity is correlated to a Metavir fibrosis score. COMPARISON:  08/06/2011 abdominal sonogram. FINDINGS: ULTRASOUND ABDOMEN Gallbladder: Surgically absent . Common bile duct: Diameter: 8 mm Liver: No focal lesion identified. Within normal limits in parenchymal echogenicity. IVC: No abnormality visualized. Pancreas: Visualized portion unremarkable. Spleen: Size and appearance within normal limits. Right Kidney: Length: 10.5 cm. Echogenicity within normal limits. No hydronephrosis. Minimally complex 1.0 x 0.9 x 1.1 cm renal cyst in the upper right kidney with 2 mm echogenic mural focus, which measured 0.7 x 0.5 x 0.5 cm in 2012, minimally increased. Left Kidney: Length: 9.9 cm. Echogenicity within normal limits. No mass or hydronephrosis visualized. Abdominal aorta: No aneurysm visualized. Other findings: None. ULTRASOUND HEPATIC ELASTOGRAPHY Device: Siemens Helix VTQ Transducer 6C1 Patient position: Supine Number of measurements:  10 Hepatic Segment:  8 Median velocity:   2.7  m/sec IQR: 0.54 IQR/Median velocity ratio 0.20 Corresponding Metavir fibrosis score:  Some F3 + F4 Risk of fibrosis: High Limitations of exam: None Pertinent findings noted on other imaging exams:  None Please note that abnormal shear wave velocities may also be identified in clinical settings other than with hepatic fibrosis, such as: acute hepatitis, elevated right heart and central venous pressures including use of beta  blockers, veno-occlusive disease (Budd[place]Chiari), infiltrative processes such as mastocytosis/amyloidosis/infiltrative tumor, extrahepatic cholestasis, in the post-prandial state, and liver transplantation. Correlation with patient history, laboratory data, and clinical condition recommended. IMPRESSION: 1. Normal liver on routine sonogram.  No liver mass detected. 2. Status post cholecystectomy. Bile ducts are within expected post cholecystectomy limits. 3. Minimally complex 1.1 cm renal cyst in the upper right kidney with 2 mm echogenic mural focus likely representing tiny wall calcification, minimally increased in size since 2012, probably benign. Recommend follow-up retroperitoneal sonogram in 6-12 months to demonstrate stability. 4. Hepatic elastography results: Median hepatic shear wave velocity is calculated at 2.7 m/sec. 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 Risk of fibrosis is high. Follow-up:  Follow-up advised. Electronically Signed   By: Ilona Sorrel M.D.   On: 08/18/2015 11:12      EKG Interpretation None      MDM   Final diagnoses:  Secondary hypertension, unspecified  Essential hypertension  Patient presents for Seroquel refill. She last took seroquel last night. She has no other complaints today. She is hypertensive in the ED with a blood pressure 155/105. Pt was seen by a home family med visit  10 days ago and that she stopped taking her lisinopril and  HCTZ and that she smokes 5 cigarettes a day. Pt states that she has an appointment on 08/30/2015 to have her HTN medication and seroquel refilled with [facility].  I discussed return precautions with the patient as well as follow-up and she verbally agrees with the plan.  I personally performed the services described in this documentation, which was scribed in my presence. The recorded information has been reviewed and is accurate.    Natasha Glazier, PA-C 08/18/15 [address] PA-C 08/18/15  1120  Quintella Reichert, MD 08/19/15 1255

## 2015-08-18 NOTE — ED Notes (Signed)
Patient states no problems today.  Patient ran out of Seroquel this morning and needs refill until she can go to [place] on November 8th.

## 2015-08-18 NOTE — Discharge Instructions (Signed)
Medicine Refill at the Emergency Department Follow-up with [facility] for hypertension medication and Seroquel refill. We have refilled your medicine today, but it is best for you to get refills through your primary health care provider's office. In the future, please plan ahead so you do not need to get refills from the emergency department. If the medicine we refilled was a maintenance medicine, you may have received only enough to get you by until you are able to see your regular health care provider.   This information is not intended to replace advice given to you by your health care provider. Make sure you discuss any questions you have with your health care provider.   Document Released: 01/25/2004 Document Revised: 10/29/2014 Document Reviewed: 01/15/2014 Elsevier Interactive Patient Education [employer].

## 2015-08-22 DIAGNOSIS — N281 Cyst of kidney, acquired: Secondary | ICD-10-CM | POA: Insufficient documentation

## 2015-08-25 ENCOUNTER — Encounter: Payer: Self-pay | Admitting: Family Medicine

## 2015-08-25 ENCOUNTER — Ambulatory Visit (INDEPENDENT_AMBULATORY_CARE_PROVIDER_SITE_OTHER): Payer: Medicare Other | Admitting: Family Medicine

## 2015-08-25 ENCOUNTER — Encounter: Payer: Self-pay | Admitting: Pharmacist

## 2015-08-25 ENCOUNTER — Ambulatory Visit (INDEPENDENT_AMBULATORY_CARE_PROVIDER_SITE_OTHER): Payer: Medicare Other | Admitting: Pharmacist

## 2015-08-25 VITALS — BP 158/108 | HR 98 | Temp 98.4°F | Ht 67.0 in | Wt 187.0 lb

## 2015-08-25 DIAGNOSIS — I10 Essential (primary) hypertension: Secondary | ICD-10-CM

## 2015-08-25 DIAGNOSIS — Z720 Tobacco use: Secondary | ICD-10-CM

## 2015-08-25 DIAGNOSIS — N183 Chronic kidney disease, stage 3 (moderate): Secondary | ICD-10-CM | POA: Diagnosis not present

## 2015-08-25 DIAGNOSIS — R0600 Dyspnea, unspecified: Secondary | ICD-10-CM

## 2015-08-25 DIAGNOSIS — Z23 Encounter for immunization: Secondary | ICD-10-CM

## 2015-08-25 DIAGNOSIS — N281 Cyst of kidney, acquired: Secondary | ICD-10-CM

## 2015-08-25 DIAGNOSIS — Q6100 Congenital renal cyst, unspecified: Secondary | ICD-10-CM

## 2015-08-25 LAB — COMPREHENSIVE METABOLIC PANEL
ALBUMIN: 3.4 g/dL — AB (ref 3.6–5.1)
ALK PHOS: 99 U/L (ref 33–130)
ALT: 70 U/L — AB (ref 6–29)
AST: 87 U/L — AB (ref 10–35)
BUN: 12 mg/dL (ref 7–25)
CO2: 22 mmol/L (ref 20–31)
CREATININE: 1.14 mg/dL — AB (ref 0.50–0.99)
Calcium: 9.1 mg/dL (ref 8.6–10.4)
Chloride: 106 mmol/L (ref 98–110)
Glucose, Bld: 133 mg/dL — ABNORMAL HIGH (ref 65–99)
Potassium: 3.8 mmol/L (ref 3.5–5.3)
SODIUM: 137 mmol/L (ref 135–146)
TOTAL PROTEIN: 7.6 g/dL (ref 6.1–8.1)
Total Bilirubin: 0.6 mg/dL (ref 0.2–1.2)

## 2015-08-25 MED ORDER — FLUTICASONE FUROATE-VILANTEROL 200-25 MCG/INH IN AEPB: 1.0000 | INHALATION_SPRAY | Freq: Every day | RESPIRATORY_TRACT | Status: AC

## 2015-08-25 MED ORDER — NICOTINE 14 MG/24HR TD PT24: 14.0000 mg | MEDICATED_PATCH | Freq: Every day | TRANSDERMAL | Status: AC

## 2015-08-25 MED ORDER — LISINOPRIL 20 MG PO TABS: 20.0000 mg | ORAL_TABLET | Freq: Every day | ORAL | Status: DC

## 2015-08-25 NOTE — Progress Notes (Signed)
   Subjective:    Patient ID: Natasha Davila, female    DOB: 1950/11/03, 64 y.o.   MRN: [id number]  HPI 64 year old female presents for routine follow-up.  Recently seen by home nurse through [facility]. Patient noted to have elevated blood pressures and proteinuria. Patient was counseled to follow-up with her PCP.  Hypertension-patient previously prescribed hydrochlorothiazide and lisinopril, she is not taking either of these medications for the past 3-4 months, no chest pain, no headache, no vision changes  Tobacco abuse-patient continues to smoke 5 cigarettes per day, she is somewhat interested in smoking cessation however she states that is one of her few choices in life, she would be interested in a nicotine patch today  Dyspnea- patient continues to have dyspnea with exertion, no baseline shortness of breath, no chest pain, scheduled to have spirometry later today with Dr. 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  Respiratory: Negative for cough and shortness of breath.   Cardiovascular: Negative for chest pain and leg swelling.  Gastrointestinal: Negative for nausea and diarrhea.       Objective:   Physical Exam  Vitals: Reviewed Gen.: Pleasant female, no acute distress HEENT: Normocephalic, pupils equal round and reactive to light, extraocular movements are intact, no scleral icterus, moist because membranes, neck was supple, no anterior posterior cervical lymphadenopathy Cardiac: Regular rate and rhythm, S1 and S2 present, no murmurs, no heaves or thrills Respiratory: Clear to station bilaterally, normal effort Extremities: No edema, 2+ radial pulses bilaterally  Reviewed lab work  completed in August 2016 which identified mildly elevated creatinine of 1.21, LFTs were also slightly elevated with AST of 69 and ALT of 56  Abdominal US 07/2015 IMPRESSION: 1. Normal liver on routine sonogram. No liver mass detected. 2. Status post cholecystectomy. Bile ducts are within expected post cholecystectomy limits. 3. Minimally complex 1.1 cm renal cyst in the upper right kidney with 2 mm echogenic mural focus likely representing tiny wall calcification, minimally increased in size since 2012, probably benign. Recommend follow-up retroperitoneal sonogram in 6-12 months to demonstrate stability. 4. Hepatic elastography results:  Median hepatic shear wave velocity is calculated at 2.7 m/sec.  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  Risk of fibrosis is high.      Assessment & Plan:  No problem-specific assessment & plan notes found for this encounter.

## 2015-08-25 NOTE — Assessment & Plan Note (Signed)
Spirometry evaluation reveals Severe restrictive lung disease. Post nebulized albuterol tx revealed significant improvement in FVC and FEV1.  Reviewed results of pulmonary function tests. Patient verbalized understanding of results and education.  Patient has been experiencing dyspnea when walking for several months.  Patient is currently not using any inhalers.  Initiated Breo Ellipta 200/90mcg inhaler once daily.  Educated patient on purpose, proper use, potential adverse effects including risk of esophageal candidiasis and need to rinse mouth after each use.

## 2015-08-25 NOTE — Assessment & Plan Note (Signed)
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 Previous creatinine stable -Recheck CMP today and urine microalbumin -If evidence of proteinuria consider referral to nephrology

## 2015-08-25 NOTE — Patient Instructions (Signed)
It was nice see you today.   Blood pressure - start Lisinopril 20 mg daily, check labs today, Dr. Ree Kida will call you with results.   Tobacco - Please start Nicotine patch, instead of smoking 1 cigarrette per day (replace with a short walk)  Lungs - have Dr. Valentina Lucks check Spirometry today  Please return to office in 2 weeks to check blood pressure and follow up on smoking.

## 2015-08-25 NOTE — Assessment & Plan Note (Signed)
Patient reports dyspnea especially with exertion. Clinically suspect lung disease. No cardiac signs or symptoms. -Patient to have spirometry completed today by pharmacy team

## 2015-08-25 NOTE — Progress Notes (Signed)
Patient ID: Natasha Davila, female   DOB: [birth year], 64 y.o.   MRN: [id number] Reviewed: Agree with Dr. Graylin Shiver documentation and management.

## 2015-08-25 NOTE — Assessment & Plan Note (Signed)
Spirometry evaluation reveals Severe restrictive lung disease. Post nebulized albuterol tx revealed significant improvement in FVC and FEV1.  Reviewed results of pulmonary function tests. Patient verbalized understanding of results and education.  Patient has been experiencing dyspnea when walking for several months.  Patient is currently not using any inhalers.  Initiated Breo Ellipta 200/44mcg inhaler once daily.  Educated patient on purpose, proper use, potential adverse effects including risk of esophageal candidiasis and need to rinse mouth after each use.    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  Patient is interested in quitting but is not ready to set a quit date at this time.  She reports she became more motivated to quit following results of spirometry evaluation and learning her lung function is 64yo.  Patient has been prescribed nicotine replacement tx with 14mg  patches by her primary care provider when she is ready to quit. Encouraged patient to continue to cut back on the number of cigarettes and continue to think about setting a quit date.  Provided information on 1 800-QUIT NOW support program.

## 2015-08-25 NOTE — Assessment & Plan Note (Signed)
Renal cyst noted on recent abdominal ultrasound -Follow-up ultrasound to be completed in 6-12 months

## 2015-08-25 NOTE — Patient Instructions (Addendum)
Thank you for coming today!   Start using Breo Ellipta inhaler once a day.    Cut back on cigarettes.  Continue to think about setting a quit date to quit smoking.  Call 1-800-QUITNOW to see about getting nicotine patches.  Schedule a follow up appointment for January 2017.

## 2015-08-25 NOTE — Progress Notes (Signed)
S:    Patient arrives in good spirits.    Presents for lung function evaluation.  Patient reports breathing today is good.    Patient is a current every day smoker (5 cigarettes/day).  Previous 10 cigarettes per day in the past.    Patient has cut back the number of cigarettes over the past year.  She is interested in quitting but is not ready to set a quit date at this time.   Importance of quitting tobacco: 8/10 Confidence in quitting:  5/10 changed to 8/10 after seeing lung age.  She is considering a quit date at the beginning of the year.    O:  See "scanned report" or Documentation Flowsheet (discrete results - PFTs) for  Spirometry results. Patient provided good effort while attempting spirometry.   Lung Age = 99ya Albuterol Neb  Lot# [id number]     Exp. Apr18  A/P: Spirometry evaluation reveals Severe restrictive lung disease. Post nebulized albuterol tx revealed significant improvement in FVC and FEV1.  Reviewed results of pulmonary function tests. Patient verbalized understanding of results and education.  Patient has been experiencing dyspnea when walking for several months.  Patient is currently not using any inhalers.  Initiated Breo Ellipta 200/62mcg inhaler once daily.  Educated patient on purpose, proper use, potential adverse effects including risk of esophageal candidiasis and need to rinse mouth after each use.    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  Patient is interested in quitting but is not ready to set a quit date at this time.  She reports she became more motivated to quit following results of spirometry evaluation and learning her lung function is 64yo.  Patient has been prescribed nicotine replacement tx with 14mg  patches by her primary care provider when she is ready to quit. Encouraged patient to continue to cut back on the number of cigarettes and continue to think about setting a quit date.   Provided information on 1 800-QUIT NOW support program.    Written pt instructions provided.  F/U Clinic visit in January 2017.   Total time in face to face counseling 45 minutes.  Patient seen with Liliane Shi, PharmD Candidate, Angela Burke, PharmD Resident.and Elisabeth Most, PharmD, Resident.

## 2015-08-25 NOTE — Assessment & Plan Note (Signed)
Patient interested in tobacco cessation. -Nicotine patch provided

## 2015-08-25 NOTE — Assessment & Plan Note (Signed)
Uncontrolled -Restart lisinopril 20 mg daily -Follow-up in 2 weeks for blood pressure check -Check CMP to monitor renal function and microalbumin to evaluate for proteinuria

## 2015-08-29 ENCOUNTER — Encounter: Payer: Self-pay | Admitting: Family Medicine

## 2015-08-30 ENCOUNTER — Telehealth: Payer: Self-pay | Admitting: *Deleted

## 2015-08-30 DIAGNOSIS — R0600 Dyspnea, unspecified: Secondary | ICD-10-CM

## 2015-08-30 NOTE — Telephone Encounter (Signed)
Natasha Davila with [employer] called stating that the [facility] Inhaler is not a tier 1, which would cost $80.96.  Please change to Combivent.  Derl Barrow, RN

## 2015-08-31 NOTE — Telephone Encounter (Signed)
I have been calling her insurance company all morning to trying to get to the right department, however I have been getting the run around.  It might be better if patient call regarding her benefits.  Derl Barrow, RN

## 2015-08-31 NOTE — Telephone Encounter (Signed)
Natasha Davila - can you please check with the patient's insurance/pharmacy to see if any combined inhaled steroid and Long acting beta agonists are covered.  Combivent is not the same medication as breo.

## 2015-09-01 NOTE — Telephone Encounter (Signed)
Spoke with patient today regarding the cost of Breo Inhaler.  Patient stated she had a copy of the formulary.  Patient stated Ipratropium/ Albuterol solution is a tier 1 and she would like to try that.  Explained to patient that Ipratropium is for nebulizer and she would need a nebulizer machine.  Patient stated if she could get a machine.  Advised patient that she would need to speak with her doctor regarding nebulizer treatment.  The inhaler is for use on a daily bases and nebulizer are used as needed.  Tried to speak with someone from patient's insurance and spent almost 45 minutes being transferred from one department to another.  Tried requesting to speak with a pharmacist, however I was on hold for 30 minutes and somehow was disconnected. Will forward to PCP.  Derl Barrow, RN

## 2015-09-06 ENCOUNTER — Other Ambulatory Visit: Payer: Self-pay | Admitting: Family Medicine

## 2015-09-06 MED ORDER — IPRATROPIUM-ALBUTEROL 0.5-2.5 (3) MG/3ML IN SOLN: 3.0000 mL | Freq: Four times a day (QID) | RESPIRATORY_TRACT | Status: AC | PRN

## 2015-09-06 NOTE — Telephone Encounter (Signed)
RN staff - I am ok with trail of combivent nebulizer, I will send prescription to pharmacy, please schedule patient for pickup of nebulizer in office, thanks

## 2015-09-06 NOTE — Telephone Encounter (Signed)
Patient informed that she could pick up a nebulizer machine from nurse at [facility].  Patient stated she will be able to pick up machine on Monday.  Derl Barrow, RN

## 2015-09-06 NOTE — Addendum Note (Signed)
Addended by: [name] on: 09/06/2015 12:00 PM   Modules accepted: Orders

## 2015-09-08 DIAGNOSIS — B1920 Unspecified viral hepatitis C without hepatic coma: Secondary | ICD-10-CM | POA: Diagnosis not present

## 2015-09-08 DIAGNOSIS — Z86010 Personal history of colonic polyps: Secondary | ICD-10-CM | POA: Diagnosis not present

## 2015-09-08 DIAGNOSIS — J449 Chronic obstructive pulmonary disease, unspecified: Secondary | ICD-10-CM | POA: Diagnosis not present

## 2015-09-08 DIAGNOSIS — N281 Cyst of kidney, acquired: Secondary | ICD-10-CM | POA: Diagnosis not present

## 2015-10-08 DIAGNOSIS — J4520 Mild intermittent asthma, uncomplicated: Secondary | ICD-10-CM | POA: Diagnosis not present

## 2015-11-08 DIAGNOSIS — J4520 Mild intermittent asthma, uncomplicated: Secondary | ICD-10-CM | POA: Diagnosis not present

## 2015-12-09 DIAGNOSIS — J4520 Mild intermittent asthma, uncomplicated: Secondary | ICD-10-CM | POA: Diagnosis not present

## 2016-01-06 DIAGNOSIS — J4520 Mild intermittent asthma, uncomplicated: Secondary | ICD-10-CM | POA: Diagnosis not present

## 2016-02-06 DIAGNOSIS — J4520 Mild intermittent asthma, uncomplicated: Secondary | ICD-10-CM | POA: Diagnosis not present

## 2016-03-03 DIAGNOSIS — N390 Urinary tract infection, site not specified: Secondary | ICD-10-CM | POA: Diagnosis not present

## 2016-03-03 DIAGNOSIS — Z79899 Other long term (current) drug therapy: Secondary | ICD-10-CM | POA: Diagnosis not present

## 2016-03-03 DIAGNOSIS — M25552 Pain in left hip: Secondary | ICD-10-CM | POA: Diagnosis not present

## 2016-03-03 DIAGNOSIS — M160 Bilateral primary osteoarthritis of hip: Secondary | ICD-10-CM | POA: Diagnosis not present

## 2016-03-03 DIAGNOSIS — I10 Essential (primary) hypertension: Secondary | ICD-10-CM | POA: Diagnosis not present

## 2016-03-03 DIAGNOSIS — M25551 Pain in right hip: Secondary | ICD-10-CM | POA: Diagnosis not present

## 2016-03-03 DIAGNOSIS — M1610 Unilateral primary osteoarthritis, unspecified hip: Secondary | ICD-10-CM | POA: Diagnosis not present

## 2016-03-07 DIAGNOSIS — J4520 Mild intermittent asthma, uncomplicated: Secondary | ICD-10-CM | POA: Diagnosis not present

## 2016-04-07 DIAGNOSIS — J4520 Mild intermittent asthma, uncomplicated: Secondary | ICD-10-CM | POA: Diagnosis not present

## 2016-05-07 DIAGNOSIS — J4520 Mild intermittent asthma, uncomplicated: Secondary | ICD-10-CM | POA: Diagnosis not present

## 2016-06-07 DIAGNOSIS — J4520 Mild intermittent asthma, uncomplicated: Secondary | ICD-10-CM | POA: Diagnosis not present

## 2016-07-27 ENCOUNTER — Telehealth: Payer: Self-pay | Admitting: Family Medicine

## 2016-07-27 DIAGNOSIS — L309 Dermatitis, unspecified: Secondary | ICD-10-CM | POA: Diagnosis not present

## 2016-07-27 DIAGNOSIS — Z79899 Other long term (current) drug therapy: Secondary | ICD-10-CM | POA: Diagnosis not present

## 2016-07-27 MED ORDER — LISINOPRIL 20 MG PO TABS: 20.0000 mg | ORAL_TABLET | Freq: Every day | ORAL | 0 refills | Status: AC

## 2016-07-27 MED ORDER — LISINOPRIL 20 MG PO TABS: 20.0000 mg | ORAL_TABLET | Freq: Every day | ORAL | 0 refills | Status: DC

## 2016-07-27 NOTE — Telephone Encounter (Signed)
Spoke with patient regarding her hives.  Patient reported that the hives started on her neck now they are all over her body.  Asked patient if she took Benadryl to help, but she has not at this time.  Patient stated she spoke with a nurse yesterday that told her to buy an antihistamine and that the hives could be from her blood pressure.  Patient stated her blood pressure was 180/?Marland Kitchen  She is having dizziness, denies chest pain, SOB or headache at this time.  Patient reported not having blood pressure medication for a while now.  She is out of town in [place], [place] with family at this time.  Patient has requested a refill on Lisinopril.  Precept with Dr. Ree Kida regarding medication refill.  Refill lisinopril #30, no refills; patient must be seen before anymore refills will be approved.  Advised patient she should go tot nearest urgent care since the hives are spreading and that nurse will send refill to [facility] nearby.  Patient voiced understanding.  Derl Barrow, RN

## 2016-07-27 NOTE — Telephone Encounter (Signed)
I have reviewed the documentation by Charlynne Cousins. I agree with recommendation for patient to be seen at urgent care. One month supply of Lisinopril sent to pharmacy.

## 2016-07-27 NOTE — Telephone Encounter (Signed)
Pt has hives on her neck and it itches. Pt is out of town and would like to know what to do and if pt should go to ER. Please advise. Thanks! ep

## 2016-07-27 NOTE — Addendum Note (Signed)
Addended by: [name] on: 07/27/2016 03:00 PM   Modules accepted: Orders

## 2016-08-13 DIAGNOSIS — Z79899 Other long term (current) drug therapy: Secondary | ICD-10-CM | POA: Diagnosis not present

## 2016-08-13 DIAGNOSIS — R509 Fever, unspecified: Secondary | ICD-10-CM | POA: Diagnosis not present

## 2016-08-13 DIAGNOSIS — I10 Essential (primary) hypertension: Secondary | ICD-10-CM | POA: Diagnosis not present

## 2016-08-13 DIAGNOSIS — J40 Bronchitis, not specified as acute or chronic: Secondary | ICD-10-CM | POA: Diagnosis not present

## 2016-08-13 DIAGNOSIS — J209 Acute bronchitis, unspecified: Secondary | ICD-10-CM | POA: Diagnosis not present

## 2017-02-19 ENCOUNTER — Ambulatory Visit: Payer: Self-pay | Admitting: *Deleted

## 2017-05-03 IMAGING — US US ABDOMEN COMPLETE W/ ELASTOGRAPHY
1 series · 12 of 25 positions shown · non-contrast
Comparison: 08/06/2011 abdominal sonogram.

CLINICAL DATA: Chronic hepatitis-C.  Hypertension.



[Series 1: us abdomen complete w/ elastography · 0.21mm/px · 12 of 120 slices shown]
[im 5/120]
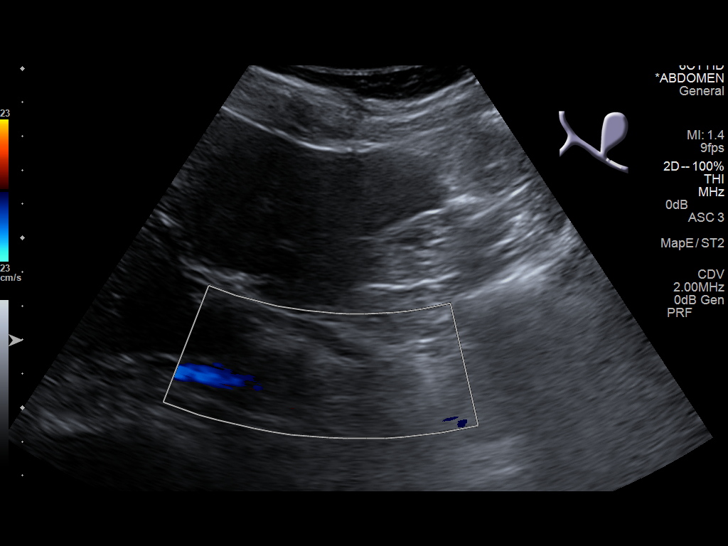
[im 15/120]
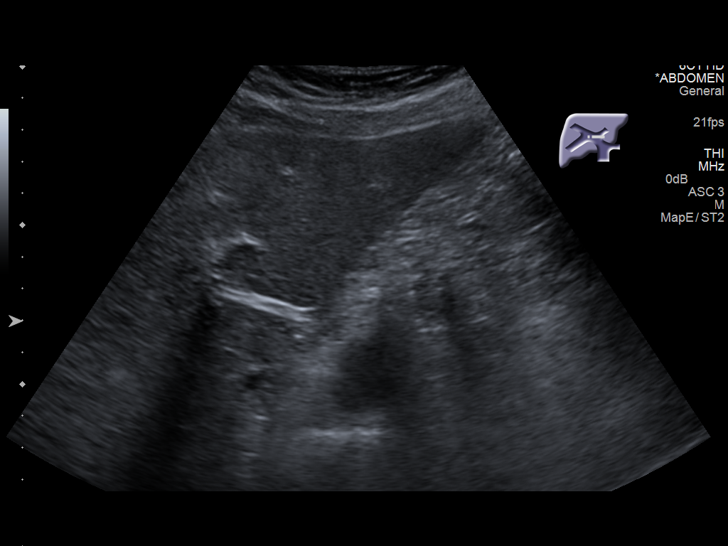
[im 25/120]
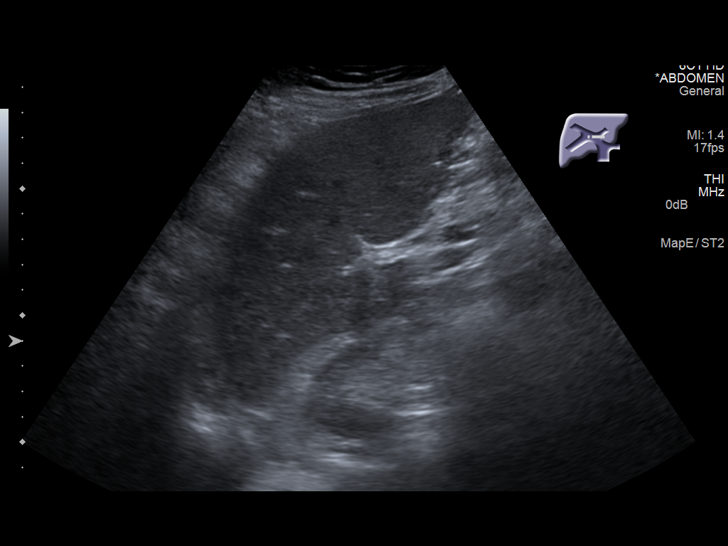
[im 35/120]
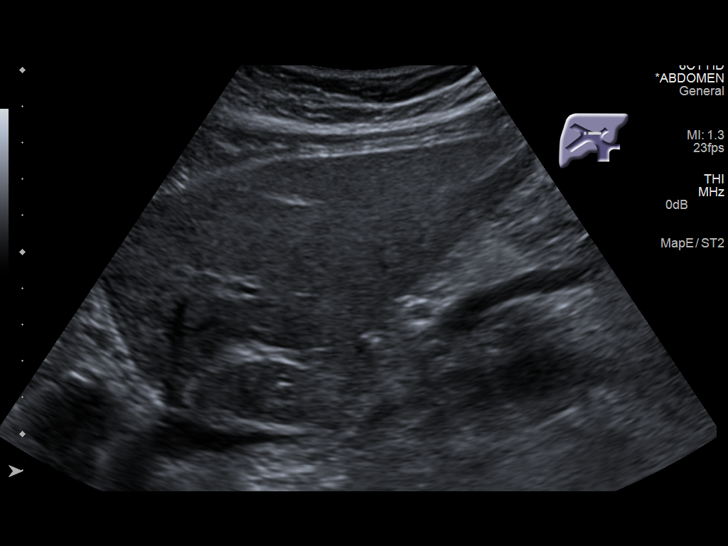
[im 45/120]
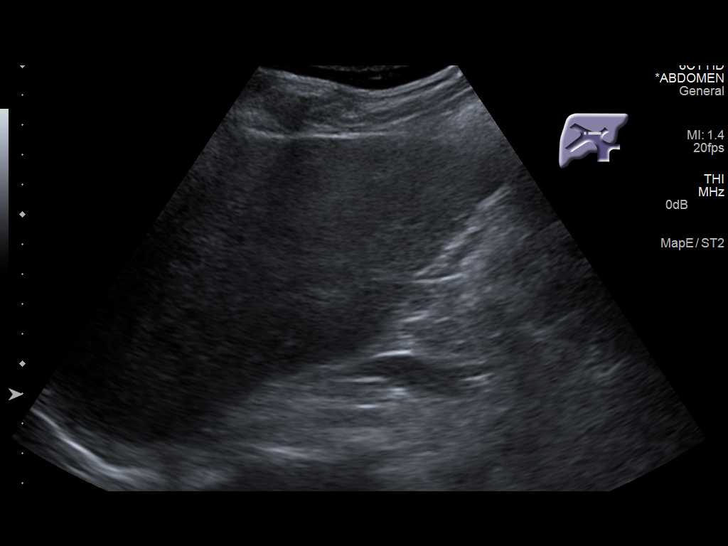
[im 55/120]
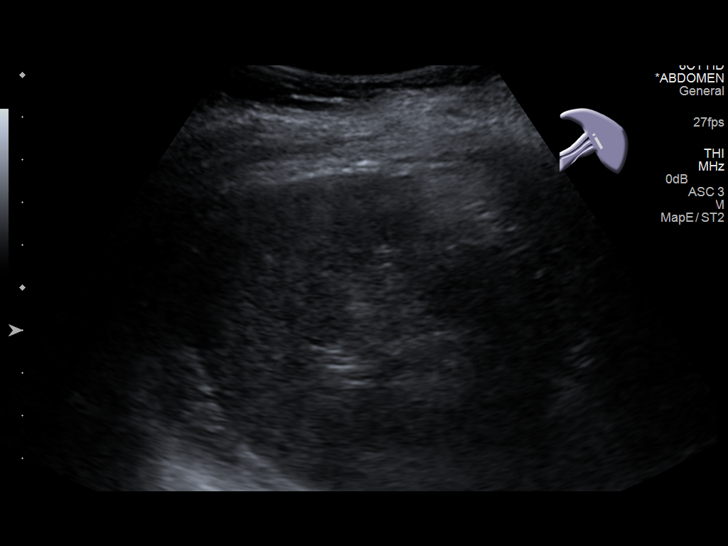
[im 65/120]
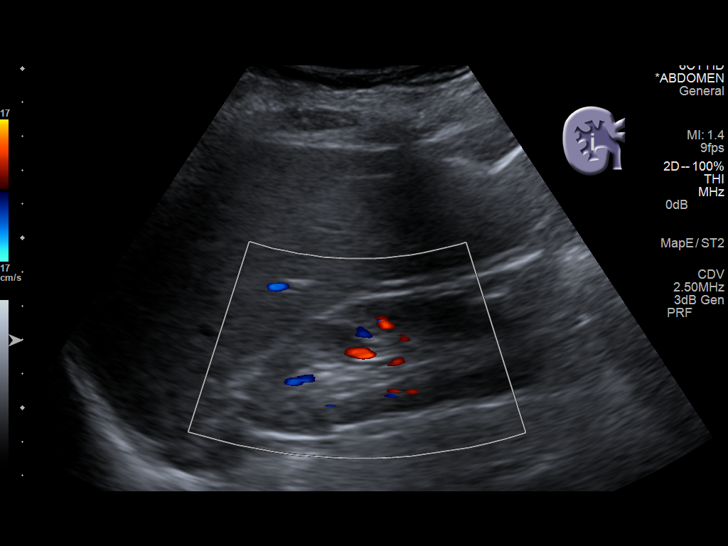
[im 75/120]
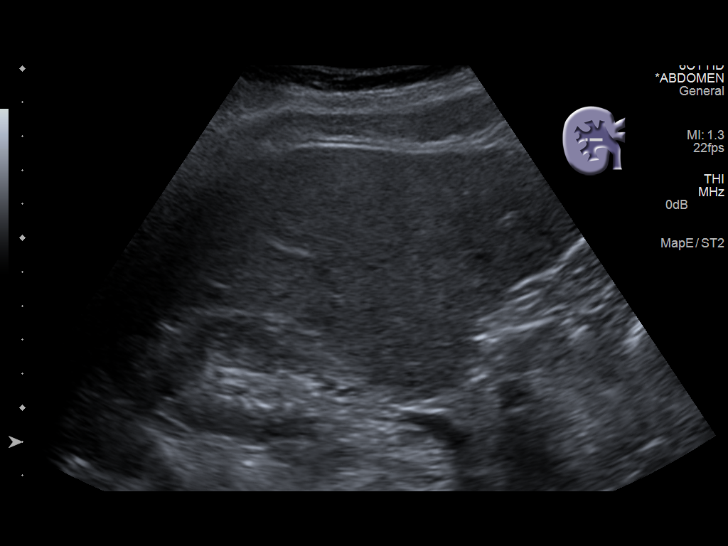
[im 85/120]
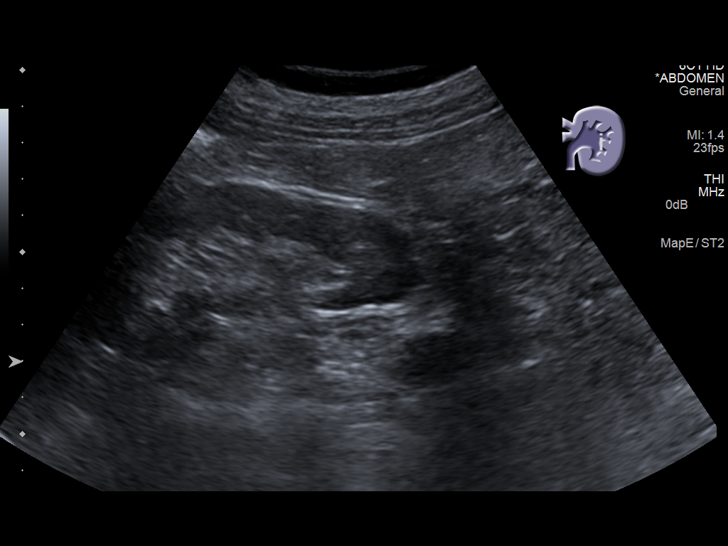
[im 95/120]
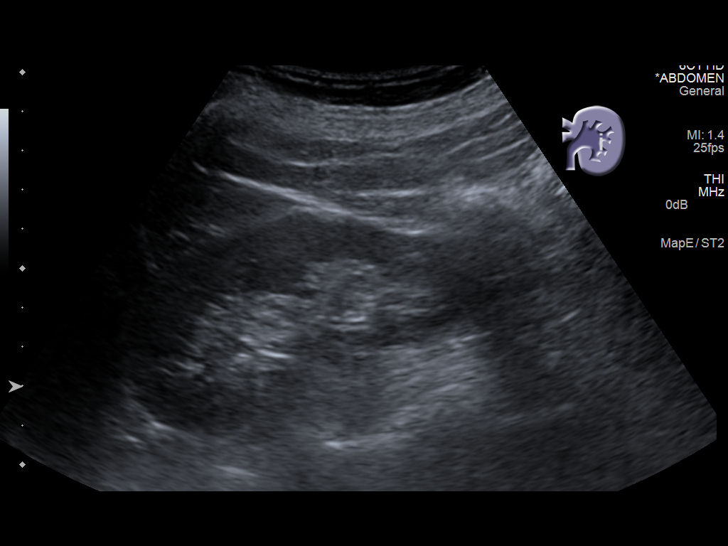
[im 105/120]
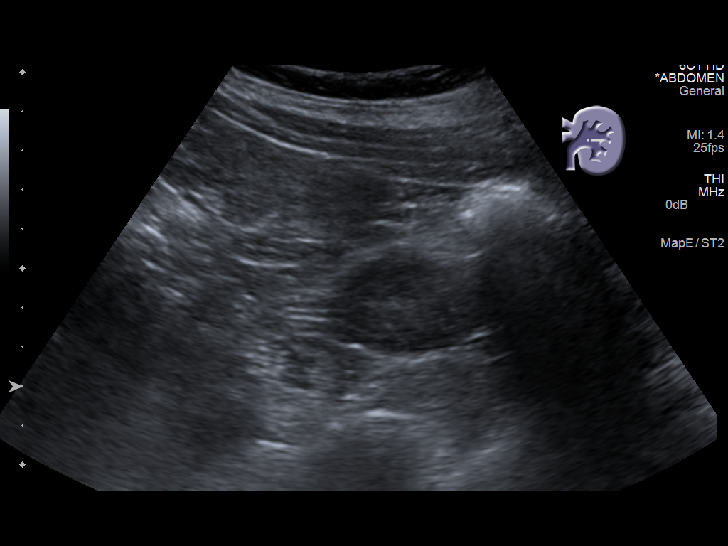
[im 115/120]
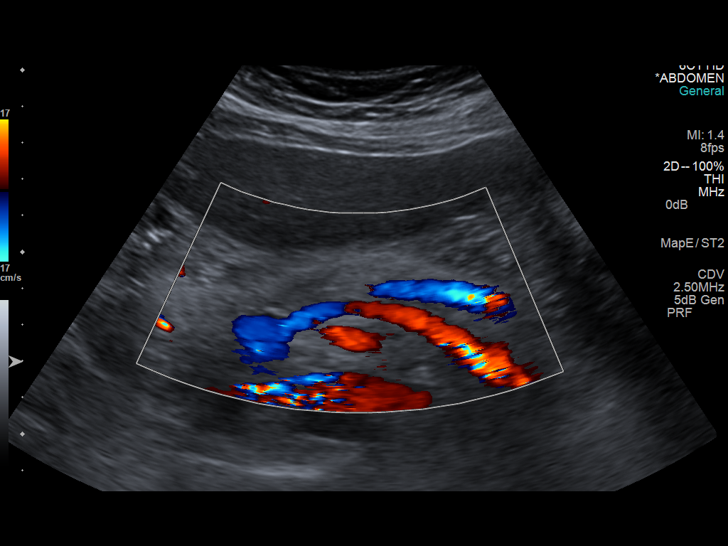

[12 of 25 positions shown; findings below may reference images not displayed]

FINDINGS: ULTRASOUND ABDOMEN

Gallbladder: Surgically absent .

Common bile duct: Diameter: 8 mm

Liver: No focal lesion identified. Within normal limits in
parenchymal echogenicity.

IVC: No abnormality visualized.

Pancreas: Visualized portion unremarkable.

Spleen: Size and appearance within normal limits.

Right Kidney: Length: 10.5 cm. Echogenicity within normal limits. No
hydronephrosis. Minimally complex 1.0 x 0.9 x 1.1 cm renal cyst in
the upper right kidney with 2 mm echogenic mural focus, which
measured 0.7 x 0.5 x 0.5 cm in 8078, minimally increased.

Left Kidney: Length: 9.9 cm. Echogenicity within normal limits. No
mass or hydronephrosis visualized.

Abdominal aorta: No aneurysm visualized.

Other findings: None.

ULTRASOUND HEPATIC ELASTOGRAPHY

Device: Siemens Helix VTQ

Transducer 6C1

Patient position: Supine

Number of measurements:  10

Hepatic Segment:  8

Median velocity:   2.7  m/sec

IQR:

IQR/Median velocity ratio

Corresponding Metavir fibrosis score:  Some F3 + F4

Risk of fibrosis: High

Limitations of exam: None

Pertinent findings noted on other imaging exams:  None

Please note that abnormal shear wave velocities may also be
identified in clinical settings other than with hepatic fibrosis,
such as: acute hepatitis, elevated right heart and central venous
pressures including use of beta blockers, Braulio disease
([place]), infiltrative processes such as
mastocytosis/amyloidosis/infiltrative tumor, extrahepatic
cholestasis, in the post-prandial state, and liver transplantation.
Correlation with patient history, laboratory data, and clinical
condition recommended.
IMPRESSION: 1. Normal liver on routine sonogram.  No liver mass detected.
2. Status post cholecystectomy. Bile ducts are within expected post
cholecystectomy limits.
3. Minimally complex 1.1 cm renal cyst in the upper right kidney
with 2 mm echogenic mural focus likely representing tiny wall
calcification, minimally increased in size since 8078, probably
benign. Recommend follow-up retroperitoneal sonogram in 6-12 months
to demonstrate stability.
4. Hepatic elastography results:

Median hepatic shear wave velocity is calculated at 2.7 m/sec.

Corresponding Metavir fibrosis score is Some F3 + F4.

Risk of fibrosis is high.

Follow-up:  Follow-up advised.

## 2017-07-11 DIAGNOSIS — G4700 Insomnia, unspecified: Secondary | ICD-10-CM | POA: Diagnosis not present

## 2017-07-11 DIAGNOSIS — J441 Chronic obstructive pulmonary disease with (acute) exacerbation: Secondary | ICD-10-CM | POA: Diagnosis not present

## 2017-07-11 DIAGNOSIS — R05 Cough: Secondary | ICD-10-CM | POA: Diagnosis not present

## 2017-07-11 DIAGNOSIS — J189 Pneumonia, unspecified organism: Secondary | ICD-10-CM | POA: Diagnosis not present

## 2017-07-11 DIAGNOSIS — R079 Chest pain, unspecified: Secondary | ICD-10-CM | POA: Diagnosis not present

## 2017-07-11 DIAGNOSIS — I10 Essential (primary) hypertension: Secondary | ICD-10-CM | POA: Diagnosis not present

## 2017-07-11 DIAGNOSIS — J440 Chronic obstructive pulmonary disease with acute lower respiratory infection: Secondary | ICD-10-CM | POA: Diagnosis not present

## 2017-07-11 DIAGNOSIS — M791 Myalgia: Secondary | ICD-10-CM | POA: Diagnosis not present

## 2017-07-11 DIAGNOSIS — R062 Wheezing: Secondary | ICD-10-CM | POA: Diagnosis not present

## 2017-07-11 DIAGNOSIS — Z885 Allergy status to narcotic agent status: Secondary | ICD-10-CM | POA: Diagnosis not present

## 2017-07-19 DIAGNOSIS — J189 Pneumonia, unspecified organism: Secondary | ICD-10-CM | POA: Diagnosis not present

## 2017-07-19 DIAGNOSIS — I10 Essential (primary) hypertension: Secondary | ICD-10-CM | POA: Diagnosis not present

## 2017-07-25 DIAGNOSIS — J189 Pneumonia, unspecified organism: Secondary | ICD-10-CM | POA: Diagnosis not present

## 2017-08-15 DIAGNOSIS — I10 Essential (primary) hypertension: Secondary | ICD-10-CM | POA: Diagnosis not present

## 2017-08-15 DIAGNOSIS — J439 Emphysema, unspecified: Secondary | ICD-10-CM | POA: Diagnosis not present

## 2017-08-22 DIAGNOSIS — I10 Essential (primary) hypertension: Secondary | ICD-10-CM | POA: Diagnosis not present

## 2017-08-22 DIAGNOSIS — J449 Chronic obstructive pulmonary disease, unspecified: Secondary | ICD-10-CM | POA: Diagnosis not present

## 2017-09-06 DIAGNOSIS — I10 Essential (primary) hypertension: Secondary | ICD-10-CM | POA: Diagnosis not present

## 2017-09-06 DIAGNOSIS — M25552 Pain in left hip: Secondary | ICD-10-CM | POA: Diagnosis not present

## 2017-09-06 DIAGNOSIS — J449 Chronic obstructive pulmonary disease, unspecified: Secondary | ICD-10-CM | POA: Diagnosis not present

## 2017-09-06 DIAGNOSIS — M1990 Unspecified osteoarthritis, unspecified site: Secondary | ICD-10-CM | POA: Diagnosis not present

## 2017-09-06 DIAGNOSIS — Z79899 Other long term (current) drug therapy: Secondary | ICD-10-CM | POA: Diagnosis not present

## 2017-09-11 DIAGNOSIS — Z96642 Presence of left artificial hip joint: Secondary | ICD-10-CM | POA: Diagnosis not present

## 2017-09-11 DIAGNOSIS — M7062 Trochanteric bursitis, left hip: Secondary | ICD-10-CM | POA: Diagnosis not present

## 2017-10-02 DIAGNOSIS — Z96642 Presence of left artificial hip joint: Secondary | ICD-10-CM | POA: Diagnosis not present

## 2017-10-02 DIAGNOSIS — M7062 Trochanteric bursitis, left hip: Secondary | ICD-10-CM | POA: Diagnosis not present

## 2017-11-23 DIAGNOSIS — I10 Essential (primary) hypertension: Secondary | ICD-10-CM | POA: Diagnosis not present

## 2017-11-23 DIAGNOSIS — J449 Chronic obstructive pulmonary disease, unspecified: Secondary | ICD-10-CM | POA: Diagnosis not present

## 2017-11-23 DIAGNOSIS — Z885 Allergy status to narcotic agent status: Secondary | ICD-10-CM | POA: Diagnosis not present

## 2017-11-23 DIAGNOSIS — Z79899 Other long term (current) drug therapy: Secondary | ICD-10-CM | POA: Diagnosis not present

## 2017-11-23 DIAGNOSIS — T783XXA Angioneurotic edema, initial encounter: Secondary | ICD-10-CM | POA: Diagnosis not present

## 2018-01-13 DIAGNOSIS — M7062 Trochanteric bursitis, left hip: Secondary | ICD-10-CM | POA: Diagnosis not present

## 2018-11-30 DIAGNOSIS — R0602 Shortness of breath: Secondary | ICD-10-CM | POA: Diagnosis not present

## 2018-11-30 DIAGNOSIS — Z885 Allergy status to narcotic agent status: Secondary | ICD-10-CM | POA: Diagnosis not present

## 2018-11-30 DIAGNOSIS — J441 Chronic obstructive pulmonary disease with (acute) exacerbation: Secondary | ICD-10-CM | POA: Diagnosis not present

## 2018-11-30 DIAGNOSIS — R9431 Abnormal electrocardiogram [ECG] [EKG]: Secondary | ICD-10-CM | POA: Diagnosis not present

## 2018-11-30 DIAGNOSIS — R05 Cough: Secondary | ICD-10-CM | POA: Diagnosis not present

## 2018-11-30 DIAGNOSIS — I10 Essential (primary) hypertension: Secondary | ICD-10-CM | POA: Diagnosis not present

## 2019-06-14 DIAGNOSIS — N200 Calculus of kidney: Secondary | ICD-10-CM | POA: Diagnosis not present

## 2019-06-14 DIAGNOSIS — M4316 Spondylolisthesis, lumbar region: Secondary | ICD-10-CM | POA: Diagnosis not present

## 2019-06-14 DIAGNOSIS — J440 Chronic obstructive pulmonary disease with acute lower respiratory infection: Secondary | ICD-10-CM | POA: Diagnosis not present

## 2019-06-14 DIAGNOSIS — A419 Sepsis, unspecified organism: Secondary | ICD-10-CM | POA: Diagnosis not present

## 2019-06-14 DIAGNOSIS — N183 Chronic kidney disease, stage 3 (moderate): Secondary | ICD-10-CM | POA: Diagnosis not present

## 2019-06-14 DIAGNOSIS — J432 Centrilobular emphysema: Secondary | ICD-10-CM | POA: Diagnosis not present

## 2019-06-14 DIAGNOSIS — M48061 Spinal stenosis, lumbar region without neurogenic claudication: Secondary | ICD-10-CM | POA: Diagnosis not present

## 2019-06-14 DIAGNOSIS — D638 Anemia in other chronic diseases classified elsewhere: Secondary | ICD-10-CM | POA: Diagnosis not present

## 2019-06-14 DIAGNOSIS — I129 Hypertensive chronic kidney disease with stage 1 through stage 4 chronic kidney disease, or unspecified chronic kidney disease: Secondary | ICD-10-CM | POA: Diagnosis not present

## 2019-06-14 DIAGNOSIS — I440 Atrioventricular block, first degree: Secondary | ICD-10-CM | POA: Diagnosis not present

## 2019-06-14 DIAGNOSIS — C9000 Multiple myeloma not having achieved remission: Secondary | ICD-10-CM | POA: Diagnosis not present

## 2019-06-14 DIAGNOSIS — R7881 Bacteremia: Secondary | ICD-10-CM | POA: Diagnosis not present

## 2019-06-14 DIAGNOSIS — R000 Tachycardia, unspecified: Secondary | ICD-10-CM | POA: Diagnosis not present

## 2019-06-14 DIAGNOSIS — R509 Fever, unspecified: Secondary | ICD-10-CM | POA: Diagnosis not present

## 2019-06-14 DIAGNOSIS — R9431 Abnormal electrocardiogram [ECG] [EKG]: Secondary | ICD-10-CM | POA: Diagnosis not present

## 2019-06-14 DIAGNOSIS — N281 Cyst of kidney, acquired: Secondary | ICD-10-CM | POA: Diagnosis not present

## 2019-06-14 DIAGNOSIS — A4151 Sepsis due to Escherichia coli [E. coli]: Secondary | ICD-10-CM | POA: Diagnosis not present

## 2019-06-14 DIAGNOSIS — R531 Weakness: Secondary | ICD-10-CM | POA: Diagnosis not present

## 2019-06-14 DIAGNOSIS — D591 Other autoimmune hemolytic anemias: Secondary | ICD-10-CM | POA: Diagnosis not present

## 2019-06-14 DIAGNOSIS — E876 Hypokalemia: Secondary | ICD-10-CM | POA: Diagnosis not present

## 2019-06-14 DIAGNOSIS — K5900 Constipation, unspecified: Secondary | ICD-10-CM | POA: Diagnosis not present

## 2019-06-14 DIAGNOSIS — E872 Acidosis: Secondary | ICD-10-CM | POA: Diagnosis not present

## 2019-06-14 DIAGNOSIS — G9341 Metabolic encephalopathy: Secondary | ICD-10-CM | POA: Diagnosis not present

## 2019-06-14 DIAGNOSIS — J189 Pneumonia, unspecified organism: Secondary | ICD-10-CM | POA: Diagnosis not present

## 2019-06-14 DIAGNOSIS — E860 Dehydration: Secondary | ICD-10-CM | POA: Diagnosis not present

## 2019-06-14 DIAGNOSIS — N10 Acute tubulo-interstitial nephritis: Secondary | ICD-10-CM | POA: Diagnosis not present

## 2019-06-14 DIAGNOSIS — R0602 Shortness of breath: Secondary | ICD-10-CM | POA: Diagnosis not present

## 2019-06-14 DIAGNOSIS — D631 Anemia in chronic kidney disease: Secondary | ICD-10-CM | POA: Diagnosis not present

## 2019-06-14 DIAGNOSIS — R770 Abnormality of albumin: Secondary | ICD-10-CM | POA: Diagnosis not present

## 2019-06-14 DIAGNOSIS — G893 Neoplasm related pain (acute) (chronic): Secondary | ICD-10-CM | POA: Diagnosis not present

## 2019-06-14 DIAGNOSIS — Z87440 Personal history of urinary (tract) infections: Secondary | ICD-10-CM | POA: Diagnosis not present

## 2019-06-14 DIAGNOSIS — R0600 Dyspnea, unspecified: Secondary | ICD-10-CM | POA: Diagnosis not present

## 2019-06-14 DIAGNOSIS — J9811 Atelectasis: Secondary | ICD-10-CM | POA: Diagnosis not present

## 2019-06-14 DIAGNOSIS — N12 Tubulo-interstitial nephritis, not specified as acute or chronic: Secondary | ICD-10-CM | POA: Diagnosis not present

## 2019-06-14 DIAGNOSIS — D6959 Other secondary thrombocytopenia: Secondary | ICD-10-CM | POA: Diagnosis not present

## 2019-06-14 DIAGNOSIS — G92 Toxic encephalopathy: Secondary | ICD-10-CM | POA: Diagnosis not present

## 2019-06-14 DIAGNOSIS — N179 Acute kidney failure, unspecified: Secondary | ICD-10-CM | POA: Diagnosis not present

## 2019-06-14 DIAGNOSIS — D649 Anemia, unspecified: Secondary | ICD-10-CM | POA: Diagnosis not present

## 2019-06-14 DIAGNOSIS — R778 Other specified abnormalities of plasma proteins: Secondary | ICD-10-CM | POA: Diagnosis not present

## 2019-06-14 DIAGNOSIS — J449 Chronic obstructive pulmonary disease, unspecified: Secondary | ICD-10-CM | POA: Diagnosis not present

## 2019-06-14 DIAGNOSIS — R197 Diarrhea, unspecified: Secondary | ICD-10-CM | POA: Diagnosis not present

## 2019-06-15 DIAGNOSIS — R0600 Dyspnea, unspecified: Secondary | ICD-10-CM | POA: Diagnosis not present

## 2019-06-15 DIAGNOSIS — N12 Tubulo-interstitial nephritis, not specified as acute or chronic: Secondary | ICD-10-CM | POA: Diagnosis not present

## 2019-06-15 DIAGNOSIS — J189 Pneumonia, unspecified organism: Secondary | ICD-10-CM | POA: Diagnosis not present

## 2019-06-15 DIAGNOSIS — N179 Acute kidney failure, unspecified: Secondary | ICD-10-CM | POA: Diagnosis not present

## 2019-06-15 DIAGNOSIS — A419 Sepsis, unspecified organism: Secondary | ICD-10-CM | POA: Diagnosis not present

## 2019-06-16 DIAGNOSIS — R0600 Dyspnea, unspecified: Secondary | ICD-10-CM | POA: Diagnosis not present

## 2019-06-16 DIAGNOSIS — R7881 Bacteremia: Secondary | ICD-10-CM | POA: Diagnosis not present

## 2019-06-16 DIAGNOSIS — J189 Pneumonia, unspecified organism: Secondary | ICD-10-CM | POA: Diagnosis not present

## 2019-06-16 DIAGNOSIS — N12 Tubulo-interstitial nephritis, not specified as acute or chronic: Secondary | ICD-10-CM | POA: Diagnosis not present

## 2019-06-16 DIAGNOSIS — N179 Acute kidney failure, unspecified: Secondary | ICD-10-CM | POA: Diagnosis not present

## 2019-06-16 DIAGNOSIS — A419 Sepsis, unspecified organism: Secondary | ICD-10-CM | POA: Diagnosis not present

## 2019-06-17 DIAGNOSIS — R7881 Bacteremia: Secondary | ICD-10-CM | POA: Diagnosis not present

## 2019-06-17 DIAGNOSIS — N179 Acute kidney failure, unspecified: Secondary | ICD-10-CM | POA: Diagnosis not present

## 2019-06-17 DIAGNOSIS — G92 Toxic encephalopathy: Secondary | ICD-10-CM | POA: Diagnosis not present

## 2019-06-17 DIAGNOSIS — A419 Sepsis, unspecified organism: Secondary | ICD-10-CM | POA: Diagnosis not present

## 2019-06-17 DIAGNOSIS — E872 Acidosis: Secondary | ICD-10-CM | POA: Diagnosis not present

## 2019-06-17 DIAGNOSIS — E876 Hypokalemia: Secondary | ICD-10-CM | POA: Diagnosis not present

## 2019-06-17 DIAGNOSIS — N183 Chronic kidney disease, stage 3 (moderate): Secondary | ICD-10-CM | POA: Diagnosis not present

## 2019-06-17 DIAGNOSIS — N12 Tubulo-interstitial nephritis, not specified as acute or chronic: Secondary | ICD-10-CM | POA: Diagnosis not present

## 2019-06-17 DIAGNOSIS — N10 Acute tubulo-interstitial nephritis: Secondary | ICD-10-CM | POA: Diagnosis not present

## 2019-06-17 DIAGNOSIS — N281 Cyst of kidney, acquired: Secondary | ICD-10-CM | POA: Diagnosis not present

## 2019-06-17 DIAGNOSIS — D631 Anemia in chronic kidney disease: Secondary | ICD-10-CM | POA: Diagnosis not present

## 2019-06-18 DIAGNOSIS — D631 Anemia in chronic kidney disease: Secondary | ICD-10-CM | POA: Diagnosis not present

## 2019-06-18 DIAGNOSIS — N12 Tubulo-interstitial nephritis, not specified as acute or chronic: Secondary | ICD-10-CM | POA: Diagnosis not present

## 2019-06-18 DIAGNOSIS — R0600 Dyspnea, unspecified: Secondary | ICD-10-CM | POA: Diagnosis not present

## 2019-06-18 DIAGNOSIS — J189 Pneumonia, unspecified organism: Secondary | ICD-10-CM | POA: Diagnosis not present

## 2019-06-18 DIAGNOSIS — N183 Chronic kidney disease, stage 3 (moderate): Secondary | ICD-10-CM | POA: Diagnosis not present

## 2019-06-18 DIAGNOSIS — E876 Hypokalemia: Secondary | ICD-10-CM | POA: Diagnosis not present

## 2019-06-18 DIAGNOSIS — A419 Sepsis, unspecified organism: Secondary | ICD-10-CM | POA: Diagnosis not present

## 2019-06-18 DIAGNOSIS — R7881 Bacteremia: Secondary | ICD-10-CM | POA: Diagnosis not present

## 2019-06-18 DIAGNOSIS — N179 Acute kidney failure, unspecified: Secondary | ICD-10-CM | POA: Diagnosis not present

## 2019-06-19 DIAGNOSIS — R9431 Abnormal electrocardiogram [ECG] [EKG]: Secondary | ICD-10-CM | POA: Diagnosis not present

## 2019-06-19 DIAGNOSIS — E876 Hypokalemia: Secondary | ICD-10-CM | POA: Diagnosis not present

## 2019-06-19 DIAGNOSIS — J189 Pneumonia, unspecified organism: Secondary | ICD-10-CM | POA: Diagnosis not present

## 2019-06-19 DIAGNOSIS — N12 Tubulo-interstitial nephritis, not specified as acute or chronic: Secondary | ICD-10-CM | POA: Diagnosis not present

## 2019-06-19 DIAGNOSIS — R0600 Dyspnea, unspecified: Secondary | ICD-10-CM | POA: Diagnosis not present

## 2019-06-19 DIAGNOSIS — N179 Acute kidney failure, unspecified: Secondary | ICD-10-CM | POA: Diagnosis not present

## 2019-06-19 DIAGNOSIS — D631 Anemia in chronic kidney disease: Secondary | ICD-10-CM | POA: Diagnosis not present

## 2019-06-19 DIAGNOSIS — R000 Tachycardia, unspecified: Secondary | ICD-10-CM | POA: Diagnosis not present

## 2019-06-19 DIAGNOSIS — N183 Chronic kidney disease, stage 3 (moderate): Secondary | ICD-10-CM | POA: Diagnosis not present

## 2019-06-19 DIAGNOSIS — A419 Sepsis, unspecified organism: Secondary | ICD-10-CM | POA: Diagnosis not present

## 2019-06-19 DIAGNOSIS — I440 Atrioventricular block, first degree: Secondary | ICD-10-CM | POA: Diagnosis not present

## 2019-06-20 DIAGNOSIS — N200 Calculus of kidney: Secondary | ICD-10-CM | POA: Diagnosis not present

## 2019-06-20 DIAGNOSIS — J189 Pneumonia, unspecified organism: Secondary | ICD-10-CM | POA: Diagnosis not present

## 2019-06-20 DIAGNOSIS — R0600 Dyspnea, unspecified: Secondary | ICD-10-CM | POA: Diagnosis not present

## 2019-06-20 DIAGNOSIS — A419 Sepsis, unspecified organism: Secondary | ICD-10-CM | POA: Diagnosis not present

## 2019-06-20 DIAGNOSIS — N179 Acute kidney failure, unspecified: Secondary | ICD-10-CM | POA: Diagnosis not present

## 2019-06-20 DIAGNOSIS — N12 Tubulo-interstitial nephritis, not specified as acute or chronic: Secondary | ICD-10-CM | POA: Diagnosis not present

## 2019-06-21 DIAGNOSIS — A419 Sepsis, unspecified organism: Secondary | ICD-10-CM | POA: Diagnosis not present

## 2019-06-21 DIAGNOSIS — M4316 Spondylolisthesis, lumbar region: Secondary | ICD-10-CM | POA: Diagnosis not present

## 2019-06-21 DIAGNOSIS — R0600 Dyspnea, unspecified: Secondary | ICD-10-CM | POA: Diagnosis not present

## 2019-06-21 DIAGNOSIS — J189 Pneumonia, unspecified organism: Secondary | ICD-10-CM | POA: Diagnosis not present

## 2019-06-21 DIAGNOSIS — N179 Acute kidney failure, unspecified: Secondary | ICD-10-CM | POA: Diagnosis not present

## 2019-06-21 DIAGNOSIS — M48061 Spinal stenosis, lumbar region without neurogenic claudication: Secondary | ICD-10-CM | POA: Diagnosis not present

## 2019-06-21 DIAGNOSIS — N12 Tubulo-interstitial nephritis, not specified as acute or chronic: Secondary | ICD-10-CM | POA: Diagnosis not present

## 2019-06-22 DIAGNOSIS — R0602 Shortness of breath: Secondary | ICD-10-CM | POA: Diagnosis not present

## 2019-06-22 DIAGNOSIS — N12 Tubulo-interstitial nephritis, not specified as acute or chronic: Secondary | ICD-10-CM | POA: Diagnosis not present

## 2019-06-22 DIAGNOSIS — R509 Fever, unspecified: Secondary | ICD-10-CM | POA: Diagnosis not present

## 2019-06-22 DIAGNOSIS — N179 Acute kidney failure, unspecified: Secondary | ICD-10-CM | POA: Diagnosis not present

## 2019-06-22 DIAGNOSIS — R0600 Dyspnea, unspecified: Secondary | ICD-10-CM | POA: Diagnosis not present

## 2019-06-22 DIAGNOSIS — J189 Pneumonia, unspecified organism: Secondary | ICD-10-CM | POA: Diagnosis not present

## 2019-06-22 DIAGNOSIS — D649 Anemia, unspecified: Secondary | ICD-10-CM | POA: Diagnosis not present

## 2019-06-22 DIAGNOSIS — A419 Sepsis, unspecified organism: Secondary | ICD-10-CM | POA: Diagnosis not present

## 2019-06-23 DIAGNOSIS — D649 Anemia, unspecified: Secondary | ICD-10-CM | POA: Diagnosis not present

## 2019-06-23 DIAGNOSIS — N12 Tubulo-interstitial nephritis, not specified as acute or chronic: Secondary | ICD-10-CM | POA: Diagnosis not present

## 2019-06-23 DIAGNOSIS — R7881 Bacteremia: Secondary | ICD-10-CM | POA: Diagnosis not present

## 2019-06-23 DIAGNOSIS — N179 Acute kidney failure, unspecified: Secondary | ICD-10-CM | POA: Diagnosis not present

## 2019-06-24 DIAGNOSIS — D649 Anemia, unspecified: Secondary | ICD-10-CM | POA: Diagnosis not present

## 2019-06-24 DIAGNOSIS — N12 Tubulo-interstitial nephritis, not specified as acute or chronic: Secondary | ICD-10-CM | POA: Diagnosis not present

## 2019-06-24 DIAGNOSIS — N179 Acute kidney failure, unspecified: Secondary | ICD-10-CM | POA: Diagnosis not present

## 2019-06-25 DIAGNOSIS — N179 Acute kidney failure, unspecified: Secondary | ICD-10-CM | POA: Diagnosis not present

## 2019-06-25 DIAGNOSIS — J432 Centrilobular emphysema: Secondary | ICD-10-CM | POA: Diagnosis not present

## 2019-06-25 DIAGNOSIS — D649 Anemia, unspecified: Secondary | ICD-10-CM | POA: Diagnosis not present

## 2019-06-25 DIAGNOSIS — N12 Tubulo-interstitial nephritis, not specified as acute or chronic: Secondary | ICD-10-CM | POA: Diagnosis not present

## 2019-06-26 DIAGNOSIS — N179 Acute kidney failure, unspecified: Secondary | ICD-10-CM | POA: Diagnosis not present

## 2019-06-26 DIAGNOSIS — N12 Tubulo-interstitial nephritis, not specified as acute or chronic: Secondary | ICD-10-CM | POA: Diagnosis not present

## 2019-06-26 DIAGNOSIS — D649 Anemia, unspecified: Secondary | ICD-10-CM | POA: Diagnosis not present

## 2019-06-26 DIAGNOSIS — R7881 Bacteremia: Secondary | ICD-10-CM | POA: Diagnosis not present

## 2019-06-27 DIAGNOSIS — N179 Acute kidney failure, unspecified: Secondary | ICD-10-CM | POA: Diagnosis not present

## 2019-06-27 DIAGNOSIS — D649 Anemia, unspecified: Secondary | ICD-10-CM | POA: Diagnosis not present

## 2019-06-27 DIAGNOSIS — N12 Tubulo-interstitial nephritis, not specified as acute or chronic: Secondary | ICD-10-CM | POA: Diagnosis not present

## 2019-06-27 DIAGNOSIS — R7881 Bacteremia: Secondary | ICD-10-CM | POA: Diagnosis not present

## 2019-06-28 DIAGNOSIS — D649 Anemia, unspecified: Secondary | ICD-10-CM | POA: Diagnosis not present

## 2019-06-28 DIAGNOSIS — N12 Tubulo-interstitial nephritis, not specified as acute or chronic: Secondary | ICD-10-CM | POA: Diagnosis not present

## 2019-06-28 DIAGNOSIS — N179 Acute kidney failure, unspecified: Secondary | ICD-10-CM | POA: Diagnosis not present

## 2019-06-28 DIAGNOSIS — R7881 Bacteremia: Secondary | ICD-10-CM | POA: Diagnosis not present

## 2019-06-29 DIAGNOSIS — D649 Anemia, unspecified: Secondary | ICD-10-CM | POA: Diagnosis not present

## 2019-06-29 DIAGNOSIS — N179 Acute kidney failure, unspecified: Secondary | ICD-10-CM | POA: Diagnosis not present

## 2019-06-29 DIAGNOSIS — N12 Tubulo-interstitial nephritis, not specified as acute or chronic: Secondary | ICD-10-CM | POA: Diagnosis not present

## 2024-06-25 ENCOUNTER — Telehealth: Payer: Self-pay | Admitting: Internal Medicine

## 2024-07-29 ENCOUNTER — Telehealth: Payer: Self-pay | Admitting: Internal Medicine

## 2024-07-30 ENCOUNTER — Ambulatory Visit: Payer: Self-pay | Admitting: Internal Medicine
